# Patient Record
Sex: Female | Born: 1965 | Race: White | Hispanic: No | Marital: Single | Smoking: Current every day smoker
Health system: Southern US, Community
[De-identification: ages and names within clinical notes are randomized; demographics above are authoritative.]

## PROBLEM LIST (undated history)

## (undated) DIAGNOSIS — E079 Disorder of thyroid, unspecified: Secondary | ICD-10-CM

## (undated) DIAGNOSIS — E669 Obesity, unspecified: Secondary | ICD-10-CM

## (undated) DIAGNOSIS — B1920 Unspecified viral hepatitis C without hepatic coma: Secondary | ICD-10-CM

## (undated) HISTORY — DX: Obesity, unspecified: E66.9

## (undated) HISTORY — PX: TONSILLECTOMY: SHX5217

---

## 1997-11-14 HISTORY — PX: FRACTURE SURGERY: SHX138

## 2010-07-10 ENCOUNTER — Emergency Department (HOSPITAL_BASED_OUTPATIENT_CLINIC_OR_DEPARTMENT_OTHER): Admission: EM | Admit: 2010-07-10 | Discharge: 2010-07-10 | Payer: Self-pay | Admitting: Emergency Medicine

## 2010-07-14 ENCOUNTER — Ambulatory Visit: Payer: Self-pay | Admitting: Family

## 2010-07-14 DIAGNOSIS — F17200 Nicotine dependence, unspecified, uncomplicated: Secondary | ICD-10-CM

## 2010-07-14 DIAGNOSIS — R609 Edema, unspecified: Secondary | ICD-10-CM

## 2010-07-14 DIAGNOSIS — E876 Hypokalemia: Secondary | ICD-10-CM

## 2010-07-14 DIAGNOSIS — R7309 Other abnormal glucose: Secondary | ICD-10-CM

## 2010-07-14 LAB — CONVERTED CEMR LAB
ALT: 96 units/L — ABNORMAL HIGH (ref 0–35)
AST: 198 units/L — ABNORMAL HIGH (ref 0–37)
BUN: 10 mg/dL (ref 6–23)
Bilirubin, Direct: 0.9 mg/dL — ABNORMAL HIGH (ref 0.0–0.3)
CO2: 32 meq/L (ref 19–32)
Chloride: 94 meq/L — ABNORMAL LOW (ref 96–112)
Creatinine, Ser: 0.79 mg/dL (ref 0.40–1.20)
Indirect Bilirubin: 0 mg/dL (ref 0.0–0.9)
Potassium: 3.5 meq/L (ref 3.5–5.3)
Total Protein: 9.5 g/dL — ABNORMAL HIGH (ref 6.0–8.3)

## 2010-07-16 ENCOUNTER — Telehealth: Payer: Self-pay | Admitting: Family

## 2010-07-16 DIAGNOSIS — R799 Abnormal finding of blood chemistry, unspecified: Secondary | ICD-10-CM

## 2010-07-16 DIAGNOSIS — R740 Nonspecific elevation of levels of transaminase and lactic acid dehydrogenase [LDH]: Secondary | ICD-10-CM

## 2010-07-23 ENCOUNTER — Ambulatory Visit (HOSPITAL_BASED_OUTPATIENT_CLINIC_OR_DEPARTMENT_OTHER): Admission: RE | Admit: 2010-07-23 | Discharge: 2010-07-23 | Payer: Self-pay | Admitting: Internal Medicine

## 2010-07-23 ENCOUNTER — Ambulatory Visit: Payer: Self-pay | Admitting: Diagnostic Radiology

## 2010-07-25 ENCOUNTER — Telehealth: Payer: Self-pay | Admitting: Family

## 2010-07-29 ENCOUNTER — Encounter: Payer: Self-pay | Admitting: Family

## 2010-08-13 ENCOUNTER — Ambulatory Visit: Payer: Self-pay | Admitting: Family

## 2010-08-13 DIAGNOSIS — N6009 Solitary cyst of unspecified breast: Secondary | ICD-10-CM

## 2010-08-16 ENCOUNTER — Telehealth: Payer: Self-pay | Admitting: Family

## 2010-08-16 ENCOUNTER — Encounter: Payer: Self-pay | Admitting: Family

## 2010-08-16 ENCOUNTER — Encounter (INDEPENDENT_AMBULATORY_CARE_PROVIDER_SITE_OTHER): Payer: Self-pay | Admitting: *Deleted

## 2010-08-16 DIAGNOSIS — E8809 Other disorders of plasma-protein metabolism, not elsewhere classified: Secondary | ICD-10-CM

## 2010-08-30 ENCOUNTER — Ambulatory Visit: Payer: Self-pay | Admitting: Diagnostic Radiology

## 2010-08-30 ENCOUNTER — Ambulatory Visit (HOSPITAL_BASED_OUTPATIENT_CLINIC_OR_DEPARTMENT_OTHER): Admission: RE | Admit: 2010-08-30 | Discharge: 2010-08-30 | Payer: Self-pay | Admitting: Internal Medicine

## 2010-09-21 ENCOUNTER — Telehealth: Payer: Self-pay | Admitting: Family

## 2010-09-21 DIAGNOSIS — E039 Hypothyroidism, unspecified: Secondary | ICD-10-CM | POA: Insufficient documentation

## 2010-10-05 ENCOUNTER — Telehealth: Payer: Self-pay | Admitting: Family

## 2010-10-11 ENCOUNTER — Encounter: Payer: Self-pay | Admitting: Family

## 2010-11-04 ENCOUNTER — Telehealth: Payer: Self-pay | Admitting: Family

## 2010-11-05 ENCOUNTER — Encounter: Payer: Self-pay | Admitting: Family

## 2010-11-05 LAB — CONVERTED CEMR LAB
ALT: 53 units/L — ABNORMAL HIGH (ref 0–35)
ANA Titer 1: NEGATIVE
AST: 108 units/L — ABNORMAL HIGH (ref 0–37)
Albumin ELP: 32.9 % — ABNORMAL LOW (ref 55.8–66.1)
Albumin: 2.9 g/dL — ABNORMAL LOW (ref 3.5–5.2)
Alpha-1-Globulin: 3.8 % (ref 2.9–4.9)
Alpha-2-Globulin: 7.4 % (ref 7.1–11.8)
Beta Globulin: 6.1 % (ref 4.7–7.2)
Bilirubin, Direct: 0.1 mg/dL (ref 0.0–0.3)
Chloride: 97 meq/L (ref 96–112)
Creatinine, Ser: 0.83 mg/dL (ref 0.40–1.20)
Eosinophils Absolute: 0.1 10*3/uL (ref 0.0–0.7)
Ferritin: 12 ng/mL (ref 10–291)
Gamma Globulin: 45.2 % — ABNORMAL HIGH (ref 11.1–18.8)
HCT: 27.9 % — ABNORMAL LOW (ref 36.0–46.0)
Hemoglobin: 8.1 g/dL — ABNORMAL LOW (ref 12.0–15.0)
Hep A IgM: NEGATIVE
Hep B C IgM: NEGATIVE
Hepatitis B Surface Ag: NEGATIVE
Lymphs Abs: 2.3 10*3/uL (ref 0.7–4.0)
MCV: 78.8 fL (ref 78.0–100.0)
Monocytes Absolute: 0.6 10*3/uL (ref 0.1–1.0)
Monocytes Relative: 7 % (ref 3–12)
Neutrophils Relative %: 62 % (ref 43–77)
Potassium: 3.1 meq/L — ABNORMAL LOW (ref 3.5–5.3)
RBC: 3.54 M/uL — ABNORMAL LOW (ref 3.87–5.11)
TSH: 79.845 microintl units/mL — ABNORMAL HIGH (ref 0.350–4.500)
WBC: 8 10*3/uL (ref 4.0–10.5)

## 2010-11-08 ENCOUNTER — Telehealth: Payer: Self-pay | Admitting: Family

## 2010-11-09 ENCOUNTER — Ambulatory Visit: Payer: Self-pay | Admitting: Family

## 2010-11-11 ENCOUNTER — Telehealth: Payer: Self-pay | Admitting: Family

## 2010-11-12 DIAGNOSIS — D649 Anemia, unspecified: Secondary | ICD-10-CM

## 2010-11-12 LAB — CONVERTED CEMR LAB
Ferritin: 14 ng/mL (ref 10–291)
Folate: 6.3 ng/mL
HIV: NONREACTIVE
TIBC: 439 ug/dL (ref 250–470)

## 2010-11-17 ENCOUNTER — Encounter: Payer: Self-pay | Admitting: Family

## 2010-11-17 ENCOUNTER — Telehealth: Payer: Self-pay | Admitting: Family

## 2010-11-17 LAB — CONVERTED CEMR LAB: HCV Quantitative: 210000 intl units/mL — ABNORMAL HIGH (ref <43)

## 2010-11-18 ENCOUNTER — Encounter: Payer: Self-pay | Admitting: Family

## 2010-11-18 ENCOUNTER — Telehealth: Payer: Self-pay | Admitting: Family

## 2010-11-22 ENCOUNTER — Telehealth: Payer: Self-pay | Admitting: Internal Medicine

## 2010-11-26 ENCOUNTER — Encounter: Payer: Self-pay | Admitting: Family

## 2010-11-26 LAB — HM COLONOSCOPY: HM Colonoscopy: NORMAL

## 2010-11-29 ENCOUNTER — Telehealth: Payer: Self-pay | Admitting: Family

## 2010-12-03 ENCOUNTER — Ambulatory Visit
Admission: RE | Admit: 2010-12-03 | Discharge: 2010-12-03 | Payer: Self-pay | Source: Home / Self Care | Attending: Family | Admitting: Family

## 2010-12-03 ENCOUNTER — Ambulatory Visit: Admit: 2010-12-03 | Payer: Self-pay | Admitting: Family

## 2010-12-03 ENCOUNTER — Telehealth: Payer: Self-pay | Admitting: Family

## 2010-12-03 LAB — CONVERTED CEMR LAB
AFP-Tumor Marker: 5.2 ng/mL (ref 0.0–8.0)
BUN: 9 mg/dL (ref 6–23)
Chloride: 97 meq/L (ref 96–112)
Eosinophils Absolute: 0.1 10*3/uL (ref 0.0–0.7)
Glucose, Bld: 80 mg/dL (ref 70–99)
Lymphocytes Relative: 30 % (ref 12–46)
Lymphs Abs: 2.5 10*3/uL (ref 0.7–4.0)
MCV: 81 fL (ref 78.0–100.0)
Monocytes Relative: 8 % (ref 3–12)
Neutro Abs: 5 10*3/uL (ref 1.7–7.7)
Neutrophils Relative %: 60 % (ref 43–77)
Platelets: 190 10*3/uL (ref 150–400)
Potassium: 3.8 meq/L (ref 3.5–5.3)
RBC: 3.95 M/uL (ref 3.87–5.11)
Sodium: 134 meq/L — ABNORMAL LOW (ref 135–145)
WBC: 8.3 10*3/uL (ref 4.0–10.5)

## 2010-12-09 ENCOUNTER — Telehealth: Payer: Self-pay | Admitting: Family

## 2010-12-12 LAB — CONVERTED CEMR LAB
ALT: 56 units/L — ABNORMAL HIGH (ref 0–35)
AST: 117 units/L — ABNORMAL HIGH (ref 0–37)
Alkaline Phosphatase: 100 units/L (ref 39–117)
Bilirubin, Direct: 0.3 mg/dL (ref 0.0–0.3)
CO2: 33 meq/L — ABNORMAL HIGH (ref 19–32)
Calcium: 8.3 mg/dL — ABNORMAL LOW (ref 8.4–10.5)
Cholesterol: 118 mg/dL (ref 0–200)
Creatinine, Ser: 0.77 mg/dL (ref 0.40–1.20)
Glucose, Bld: 105 mg/dL — ABNORMAL HIGH (ref 70–99)
Indirect Bilirubin: 0.6 mg/dL (ref 0.0–0.9)
TSH: 24.439 microintl units/mL — ABNORMAL HIGH (ref 0.350–4.500)
Total Bilirubin: 0.9 mg/dL (ref 0.3–1.2)

## 2010-12-13 ENCOUNTER — Telehealth: Payer: Self-pay | Admitting: Family

## 2010-12-15 ENCOUNTER — Telehealth: Payer: Self-pay | Admitting: Family
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
## 2010-12-16 NOTE — Progress Notes (Signed)
  Phone Note Outgoing Call   Summary of Call: Left message on pt's cell requesting that she call me back to discuss lab work and follow up.  Initial call taken by: Lemont Fillers FNP,  October 05, 2010 8:22 AM  Follow-up for Phone Call        Requested that recall letter be mailed certified. Follow-up by: Lemont Fillers FNP,  October 11, 2010 8:50 AM

## 2010-12-16 NOTE — Miscellaneous (Signed)
Summary: Appointment Canceled  Appointment status changed to canceled by [employer] on 07/29/2010 1:48 PM.  Cancellation Comments --------------------- echo/edema  Appointment Information ----------------------- Appt Type:  CARDIOLOGY ANCILLARY VISIT      Date:  Friday, July 30, 2010      Time:  2:00 PM for 60 min   Urgency:  Routine   Made By:  Pearson Grippe  To Visit:  [id number]    Reason:  echo/edema  Appt Comments ------------- -- 07/29/10 13:48: ([facility]) CANCELED -- echo/edema -- 07/15/10 8:51: ([facility]) BOOKED -- Routine CARDIOLOGY ANCILLARY VISIT at 07/30/2010 2:00 PM for 60 min echo/edema

## 2010-12-16 NOTE — Progress Notes (Signed)
  Phone Note Outgoing Call   Call placed by: Lemont Fillers FNP,  July 16, 2010 9:18 AM Call placed to: Patient Summary of Call: Called patient and reviewed lab work.  Will send for abdominal US to evaluate liver.  Will also order acute hepatitis panel, Serum Iron, TIBC, TSH, and serum protein electrophoresis. Pt instructed to complete labs at time of ultraound. Initial call taken by: Lemont Fillers FNP,  July 16, 2010 9:24 AM  New Problems: PREVENTIVE HEALTH CARE (ICD-V70.0) OTHER NONSPECIFIC FINDINGS EXAMINATION OF BLOOD (ICD-790.99) TRANSAMINASES, SERUM, ELEVATED (ICD-790.4)   New Problems: PREVENTIVE HEALTH CARE (ICD-V70.0) OTHER NONSPECIFIC FINDINGS EXAMINATION OF BLOOD (ICD-790.99) TRANSAMINASES, SERUM, ELEVATED (ICD-790.4)

## 2010-12-16 NOTE — Assessment & Plan Note (Signed)
Summary: nurse visit for twinrix / tf,cma  Nurse Visit  CC: Pt here for first Twinrix inj. per order. Pt will need 2nd inj. in 1 month. Pt states she will call to schedule appt.   Allergies: No Known Drug Allergies  Immunizations Administered:  TwinRix # 1:    Vaccine Type: TwinRix    Site: left deltoid    Mfr: GlaxoSmithKline    Dose: 0.5 ml    Route: IM    Given by: Mervin Kung CMA (AAMA)    Exp. Date: 08/20/2012    Lot #: [id number]    VIS given: 08/02/07 version given December 03, 2010.  Orders Added: 1)  TwinRix 1ml ( Hep A&B Adult dose) [90636] 2)  Admin 1st Vaccine [90471]

## 2010-12-16 NOTE — Progress Notes (Signed)
Summary: nurse visit, iron supplement  Phone Note Outgoing Call   Summary of Call: Please call patient and arrange a nurse visit for Twinrix vaccine.  This will vaccinate her against Hep A and B which is important.  Should be given at 0, 1 and 6 months.  Also, I would like for her to do some additional lab work at that visit.  Lab work that has been completed thus far confirms that she does have Hep C.  HIV testing is negative. I have referred her to the Hepatitis C clinic,  and [facility].  She will be contacted by Myriam Jacobson about these appointments.  Also- her iron is low.  I would like for her to start an iron supplement as listed below.  Initial call taken by: Lemont Fillers FNP,  November 17, 2010 2:14 PM  Follow-up for Phone Call        Pt has been notified. Pt scheduled nurse visit on 11/26/10 for first Twinrix injection and to pick up an IFOB kit.  Will send order for additional labs.  Nicki Guadalajara Fergerson CMA (AAMA)  November 17, 2010 4:45 PM     New/Updated Medications: FERROUS SULFATE 325 (65 FE) MG TBEC (FERROUS SULFATE) one tablet by mouth three times a day

## 2010-12-16 NOTE — Progress Notes (Signed)
Summary: needs labs   Phone Note Call from Patient   Caller: Patient Call For: osullivan  Summary of Call: she states you need her to do labs for her thyroid meds.  please call about when you want her to do this.  She is off 12-23 12-26 and 12-27  call [phone] Initial call taken by: Roselle Locus,  November 04, 2010 3:01 PM  Follow-up for Phone Call        See 11/8 phone note for lab work to be drawn.  I would like her to complete the lab work today if possible and follow up in the office on Tuesday.  Also, please verify phone numbers and address.  Follow-up by: Lemont Fillers FNP,  November 05, 2010 7:59 AM  Additional Follow-up for Phone Call Additional follow up Details #1::        Pt notified and verified address and phone number on file are correct.  Pt will have labs drawn today and scheduled f/u with Melissa on 11/09/10 @ 2:30pm.  Lab order reprinted and faxed to the lab. Nicki Guadalajara Fergerson CMA Duncan Dull)  November 05, 2010 8:45 AM

## 2010-12-16 NOTE — Letter (Signed)
Summary: Generic Letter  [facility]  [address]   [place], [place] [address]   Phone: [phone]  Fax: [phone]    10/11/2010  Ajah Gavidia [address] [place], [place]  [address]  Dear Ms. Fortenberry,  We have tried unsuccessfully to contact you by phone.  I have reviewed your abnormal lab work and feel that we need to do some additional testing to determine the cause of the abnormalities.  It is important to your health that we look into this.  Please call our office at your earliest convenience to schedule a follow up appointment.       Sincerely,   Sandford Craze FNP  Appended Document: Generic Letter Certified letter was returned to [place] as unclaimed. Pt has since contacted our office and has f/u 11/12/10.

## 2010-12-16 NOTE — Progress Notes (Signed)
Summary: Wants anemia checked today  Phone Note Call from Patient   Caller: Patient Call For: Lemont Fillers FNP Summary of Call: Pt is coming today for nurse visit twinrix inj. and to have additional labs. Pt states GI did not f/u on her iron and wants to know if we can check her anemia today.  She has been taking Slow Fe once a day and is concerned the dose may not be high enough. Has done better on Slow Fe. Please advise.  Initial call taken by: Mervin Kung CMA Duncan Dull),  December 03, 2010 10:19 AM  Follow-up for Phone Call        Please have her complete CBC in lab today.  OK to add on to blood draw if already done. Follow-up by: Lemont Fillers FNP,  December 03, 2010 10:55 AM  Additional Follow-up for Phone Call Additional follow up Details #1::        Order added and faxed to the lab. Nicki Guadalajara Fergerson CMA Duncan Dull)  December 03, 2010 11:20 AM

## 2010-12-16 NOTE — Progress Notes (Signed)
Summary: Ferrous Sulfate side effects?  Phone Note Call from Patient Call back at [phone] wk until 5pm   Caller: Patient Call For: Lemont Fillers FNP Summary of Call: Pt called stating she started her Ferrous Sulfate this weekend. Took first pill with her evening meal and had N/V that night. Has not tried to take it again. Wants to know if there is something else she can try?  Please advise.   Follow-up for Phone Call        she can try Slow Fe Controlled-Release Capsules over the counter once daily Follow-up by: D. Thomos Lemons DO,  November 23, 2010 5:36 PM  Additional Follow-up for Phone Call Additional follow up Details #1::        call returned to patient at  [phone], she has been informed per Dr Artist Pais instructions. Patient has verbalized understanding. Additional Follow-up by: Glendell Docker CMA,  November 23, 2010 5:40 PM

## 2010-12-16 NOTE — Progress Notes (Signed)
  Phone Note Outgoing Call   Call placed by: Lemont Fillers FNP,  August 16, 2010 1:11 PM Call placed to: Patient Summary of Call: Left message on cell for patient to return my call. Initial call taken by: Lemont Fillers FNP,  August 16, 2010 1:16 PM  Follow-up for Phone Call        Reviewed labs and medication changes with patient.  Recommended that she return this week to lab for additional studies.   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) New/Updated Medications: KLOR-CON M20 20 MEQ CR-TABS (POTASSIUM CHLORIDE CRYS CR) one tablet by mouth once daily LEVOTHROID 50 MCG TABS (LEVOTHYROXINE SODIUM) one tablet by mouth once daily Prescriptions: LEVOTHROID 50 MCG TABS (LEVOTHYROXINE SODIUM) one tablet by mouth once daily  #30 x 1   Entered and Authorized by:   Lemont Fillers FNP   Signed by:   Lemont Fillers FNP on 08/16/2010   Method used:   Electronically to        [facility]* (retail)       [address]       [place], [place]  [address]       Ph: [phone]       Fax: [phone]   RxID:   [id number] KLOR-CON M20 20 MEQ CR-TABS (POTASSIUM CHLORIDE CRYS CR) one tablet by mouth once daily  #30 x 1   Entered and Authorized by:   Lemont Fillers FNP   Signed by:   Lemont Fillers FNP on 08/16/2010   Method used:   Electronically to        [facility]* (retail)       [address]       [place], [place]  [address]       Ph: [phone]       Fax: [phone]   RxID:   [id number]

## 2010-12-16 NOTE — Progress Notes (Signed)
  Phone Note Outgoing Call   Call placed by: Lemont Fillers FNP,  November 08, 2010 11:40 AM Call placed to: Patient Summary of Call: Left message on Pt's cell phone reminding her to keep her appointment tomorrow as we needs to review some important lab findings. Initial call taken by: Lemont Fillers FNP,  November 08, 2010 11:40 AM

## 2010-12-16 NOTE — Progress Notes (Signed)
Summary: additional labs needed  Phone Note Call from Patient   Summary of Call: Please call patient and let her know that I would like her to complete some lab work this week and schedule an appointment to be seen next week. Lab work should include ferritin , ceruloplam, ANA, ESR, BMET, LFTs, CBC, acute hepatitis panel  (all under 790.4),serum protein electrophoresis (273.8)and TSH (244.9).  It is very important that she complete these tests.   Initial call taken by: Lemont Fillers FNP,  September 21, 2010 11:07 AM  Follow-up for Phone Call        Left message on machine to return my call. Nicki Guadalajara Fergerson CMA Duncan Dull)  September 21, 2010 11:33 AM   Left message on machine to return my call. Nicki Guadalajara Fergerson CMA Duncan Dull)  September 22, 2010 8:46 AM   Left message on machine to return my call. Nicki Guadalajara Fergerson CMA Duncan Dull)  September 23, 2010 9:45 AM   Additional Follow-up for Phone Call Additional follow up Details #1::        call placed to patient at [phone], no answer a detailed voice message was left informing patient per [facility] instructions. Lab order has been entered for the week of 09/27/2010 Additional Follow-up by: Glendell Docker CMA,  September 24, 2010 11:42 AM  New Problems: HYPOTHYROIDISM (ICD-244.9)   New Problems: HYPOTHYROIDISM (ICD-244.9)
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
## 2010-12-16 NOTE — Letter (Signed)
Summary: Primary Care Consult Scheduled Letter  [place] [facility]  [address]   [place], [place] [address]   Phone: [phone]  Fax: [phone]      08/16/2010 MRN: [id number]  Luciel Gange [address] [place], [place]  [address]    Dear Ms. Bango,      We have scheduled an appointment for you.  At the recommendation of Melissa O'Sullivan,FNP , we have scheduled you a consult with [facility], DR Dion Body  on OCTOBER 20,2011 at 1PM.  Their address is[facility] [place], [address]  [address],[place]. The office phone number is [phone].  If this appointment day and time is not convenient for you, please feel free to call the office of the doctor you are being referred to at the number listed above and reschedule the appointment.     It is important for you to keep your scheduled appointments. We are here to make sure you are given good patient care.    Thank you,  Darral Dash Patient Care Coordinator [place] [facility]

## 2010-12-16 NOTE — Progress Notes (Signed)
  Phone Note Outgoing Call   Summary of Call: Called patient, reviewed ultrasound results.  Requested that she come fasting to her upcoming apt 9/30.   Initial call taken by: Lemont Fillers FNP,  July 26, 2010 10:35 AM

## 2010-12-16 NOTE — Progress Notes (Signed)
Summary: labs  Phone Note Outgoing Call   Summary of Call: Please call patient and ask her to complete lab work on the first floor if she has not already done so.  Initial call taken by: Lemont Fillers FNP,  November 29, 2010 9:44 AM  Follow-up for Phone Call        Pt has nurse visit scheduled for 12/06/10 for inj. and will have labs done at that time. Nicki Guadalajara Fergerson CMA (AAMA)  November 29, 2010 11:08 AM

## 2010-12-16 NOTE — Progress Notes (Signed)
Summary: lab result, iron dose  Phone Note Call from Patient Call back at Work Phone [phone]   Caller: Patient Call For: Lemont Fillers FNP Reason for Call: Lab or Test Results Summary of Call: Pt called wanting most recent test results. Also wants to know if it is ok to continue the iron once a day since she was having n/v with increased dose? Please advise. Nicki Guadalajara Fergerson CMA Duncan Dull)  December 09, 2010 9:45 AM   Follow-up for Phone Call        Hemoglobin was 8.1- now up to 9.8.  This is improving.  Her sodium looks better as well.  Ok to switch to slo-FE as below once daily. Follow-up by: Lemont Fillers FNP,  December 09, 2010 11:06 AM  Additional Follow-up for Phone Call Additional follow up Details #1::        Left message on machine to return my call.  Nicki Guadalajara Fergerson CMA Duncan Dull)  December 09, 2010 12:08 PM   Lleft message on machine to return my call. Nicki Guadalajara Fergerson CMA Duncan Dull)  December 10, 2010 8:41 AM      Additional Follow-up for Phone Call Additional follow up Details #2::    Pt advised.  Follow-up by: Mervin Kung CMA Duncan Dull),  December 10, 2010 9:35 AM  New/Updated Medications: SLOW RELEASE IRON 160 (50 FE) MG CR-TABS (FERROUS SULFATE DRIED) one tablet by mouth daily   Preventive Care Screening  Colonoscopy:    Date:  11/26/2010    Results:  normal. Lipoma ascending colon.

## 2010-12-16 NOTE — Progress Notes (Signed)
Summary: Order clarification  Phone Note Call from Patient   Caller: Patient Call For: Lemont Fillers FNP Summary of Call: Pt called to let us know that Dr Vernell Barrier is going to do an EGD / colonoscopy on pt on 11/26/10. Pt requested that we fax labs from 11/12/10 to Dr Vernell Barrier. Advised pt I will fax labs today. Pt was scheduled for nurse visit on 1/13 for inj., IFOB and additional labs. Pt has r/s nurse visit for 12/06/10 @ 3:30pm.  Do we still need to do an IFOB since pt will have already had the endoscopies?  Initial call taken by: Mervin Kung CMA Duncan Dull),  November 18, 2010 3:49 PM  Follow-up for Phone Call        OK to cancel IFOB. thanks Follow-up by: Lemont Fillers FNP,  November 18, 2010 8:32 PM
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
## 2010-12-16 NOTE — Progress Notes (Signed)
Summary: appt reminder  Phone Note Outgoing Call   Summary of Call: Please call patient and let her know that it is very important that she keep her appointment tomorrow.  I need to review some important lab results with her.  Initial call taken by: Lemont Fillers FNP,  November 11, 2010 10:06 AM  Follow-up for Phone Call        Left detailed message on pt's cell re: Melissa's instruction and reminder of appt. time. Nicki Guadalajara Fergerson CMA Duncan Dull)  November 11, 2010 10:51 AM

## 2010-12-22 NOTE — Progress Notes (Signed)
  Phone Note Outgoing Call   Call placed by: Lemont Fillers FNP,  December 13, 2010 11:00 AM Summary of Call: Left message for Dr. Myriam Forehand nurse requesting call back and clarification if they will officially be consulting/treating her hepatitis C. Initial call taken by: Lemont Fillers FNP,  December 13, 2010 11:01 AM  Follow-up for Phone Call        Call from [facility]   Jamesetta Orleans  [phone]  to confirm  Dr Tomasita Morrow will follow pt for Hep C Please call Ericka   Follow-up by: Darral Dash,  December 15, 2010 8:38 AM

## 2010-12-22 NOTE — Progress Notes (Signed)
----   Converted from flag ---- ---- 12/15/2010 10:36 AM, Mervin Kung CMA (AAMA) wrote: All labs have been faxed.  ---- 12/13/2010 12:41 PM, Lemont Fillers FNP wrote: could you please fax copy off all of her labs to Dr. Vernell Barrier at [facility]? ------------------------------

## 2010-12-27 ENCOUNTER — Encounter: Payer: Self-pay | Admitting: Family

## 2010-12-30 NOTE — Letter (Signed)
Summary: [facility]  [facility]   Imported By: Lanelle Bal 12/24/2010 11:39:57  _____________________________________________________________________  External Attachment:    Type:   Image     Comment:   External Document

## 2010-12-30 NOTE — Letter (Signed)
Summary: [facility]  [facility]   Imported By: Lanelle Bal 12/24/2010 11:36:17  _____________________________________________________________________  External Attachment:    Type:   Image     Comment:   External Document

## 2010-12-30 NOTE — Procedures (Signed)
Summary: EGD & Colonoscopy/[facility]  EGD & Colonoscopy/[facility]   Imported By: Lanelle Bal 12/24/2010 11:37:35  _____________________________________________________________________  External Attachment:    Type:   Image     Comment:   External Document

## 2011-01-06 ENCOUNTER — Telehealth: Payer: Self-pay | Admitting: Family

## 2011-01-07 ENCOUNTER — Ambulatory Visit (INDEPENDENT_AMBULATORY_CARE_PROVIDER_SITE_OTHER): Payer: PRIVATE HEALTH INSURANCE

## 2011-01-07 ENCOUNTER — Encounter: Payer: Self-pay | Admitting: Family

## 2011-01-07 DIAGNOSIS — Z23 Encounter for immunization: Secondary | ICD-10-CM

## 2011-01-11 NOTE — Letter (Signed)
Summary: [facility]  [facility]   Imported By: Maryln Gottron 01/06/2011 11:31:14  _____________________________________________________________________  External Attachment:    Type:   Image     Comment:   External Document

## 2011-01-11 NOTE — Progress Notes (Signed)
Summary: CAN YOU GIVE HER A HEP VACCINE ON FRIDAY AFTERNOON   Phone Note Call from Patient Call back at [facility] Phone [phone]   Caller: Patient Call For: Chanoch Mccleery  Summary of Call: SHE NEEDS A HEPATITIS VACINNATION.  CAN SHE COME FRIDAY AFTERNOON  Initial call taken by: Roselle Locus,  January 06, 2011 11:34 AM  Follow-up for Phone Call        Spoke with pt, inj. appt has been scheduled for 01/07/11 @ 4:20. Nicki Guadalajara Fergerson CMA Duncan Dull)  January 06, 2011 11:46 AM

## 2011-01-11 NOTE — Assessment & Plan Note (Signed)
Summary: twinrix #2  Nurse Visit   Allergies: No Known Drug Allergies  Immunizations Administered:  TwinRix # 2:    Vaccine Type: TwinRix    Site: right deltoid    Mfr: GlaxoSmithKline    Dose: 1.0 ml    Route: IM    Given by: Mervin Kung CMA (AAMA)    Exp. Date: 08/20/2012    Lot #: [id number]    VIS given: 08/02/07 version given January 07, 2011.  Orders Added: 1)  TwinRix 1ml ( Hep A&B Adult dose) [90636] 2)  Admin 1st Vaccine [90471]  Pt will return in July for 3rd injection. Nicki Guadalajara Fergerson CMA Duncan Dull)  January 07, 2011 4:27 PM

## 2011-01-14 ENCOUNTER — Telehealth: Payer: Self-pay | Admitting: Family

## 2011-01-20 NOTE — Progress Notes (Signed)
Summary: refill--lasix  Phone Note Refill Request Message from:  Patient on January 14, 2011 5:03 PM  Refills Requested: Medication #1:  LASIX 40 MG TABS Take 1 tablet by mouth once a day.   Dosage confirmed as above?Dosage Confirmed   Supply Requested: 1 month   Last Refilled: 01/06/2011 Next Appointment Scheduled: 02-09-11 Peggyann Juba, NP Initial call taken by: Mervin Kung CMA (AAMA),  January 14, 2011 5:03 PM    Prescriptions: LASIX 40 MG TABS (FUROSEMIDE) Take 1 tablet by mouth once a day.  #30 Tablet x 0   Entered by:   Mervin Kung CMA (AAMA)   Authorized by:   Lemont Fillers FNP   Signed by:   Mervin Kung CMA (AAMA) on 01/14/2011   Method used:   Electronically to        [facility]* (retail)       [address]       [place], [place]  [address]       Ph: [phone]       Fax: [phone]   RxID:   [id number]

## 2011-01-25 ENCOUNTER — Telehealth: Payer: Self-pay | Admitting: Family

## 2011-01-28 LAB — BASIC METABOLIC PANEL
Calcium: 7.9 mg/dL — ABNORMAL LOW (ref 8.4–10.5)
GFR calc Af Amer: >60 mL/min (ref >60)
GFR calc non Af Amer: >60 mL/min (ref >60)
Potassium: 3.2 mEq/L — ABNORMAL LOW (ref 3.5–5.1)
Sodium: 140 mEq/L (ref 135–145)

## 2011-01-28 LAB — URINALYSIS, ROUTINE W REFLEX MICROSCOPIC
Hgb urine dipstick: NEGATIVE
Nitrite: NEGATIVE
Specific Gravity, Urine: 1.018 (ref 1.005–1.030)
Urobilinogen, UA: 1 mg/dL (ref 0.0–1.0)
pH: 6.5 (ref 5.0–8.0)

## 2011-02-01 NOTE — Progress Notes (Signed)
  Phone Note Outgoing Call   Summary of Call: Reviewed lab results from Dr. Myriam Forehand office- TSH 30.25 on 01/21/11.  Spoke with patient.  She reports + compliance with her medication.  Will increase levothyroxine to .  Pt was instructed to keep her upcoming appointment. Initial call taken by: Lemont Fillers FNP,  January 25, 2011 9:47 AM    New/Updated Medications: LEVOTHROID 100 MCG TABS (LEVOTHYROXINE SODIUM) one tab by mouth daily Prescriptions: LEVOTHROID 100 MCG TABS (LEVOTHYROXINE SODIUM) one tab by mouth daily  #30 x 1   Entered and Authorized by:   Lemont Fillers FNP   Signed by:   Lemont Fillers FNP on 01/25/2011   Method used:   Electronically to        [facility]* (retail)       [address]       [place], [place]  [address]       Ph: [phone]       Fax: [phone]   RxID:   [id number]

## 2011-02-08 ENCOUNTER — Encounter: Payer: Self-pay | Admitting: Family

## 2011-02-09 ENCOUNTER — Encounter: Payer: Self-pay | Admitting: Family

## 2011-02-09 ENCOUNTER — Ambulatory Visit (INDEPENDENT_AMBULATORY_CARE_PROVIDER_SITE_OTHER): Payer: PRIVATE HEALTH INSURANCE | Admitting: Family

## 2011-02-09 DIAGNOSIS — D649 Anemia, unspecified: Secondary | ICD-10-CM

## 2011-02-09 DIAGNOSIS — B182 Chronic viral hepatitis C: Secondary | ICD-10-CM

## 2011-02-09 DIAGNOSIS — E876 Hypokalemia: Secondary | ICD-10-CM

## 2011-02-09 DIAGNOSIS — F17200 Nicotine dependence, unspecified, uncomplicated: Secondary | ICD-10-CM

## 2011-02-09 DIAGNOSIS — E039 Hypothyroidism, unspecified: Secondary | ICD-10-CM

## 2011-02-09 LAB — BASIC METABOLIC PANEL
BUN: 6 mg/dL (ref 6–23)
Potassium: 3.6 mEq/L (ref 3.5–5.3)
Sodium: 132 mEq/L — ABNORMAL LOW (ref 135–145)

## 2011-02-09 LAB — TSH: TSH: 25.781 u[IU]/mL — ABNORMAL HIGH (ref 0.350–4.500)
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
## 2011-02-09 NOTE — Patient Instructions (Signed)
Please complete your lab work on the first floor. Good luck with your upcoming treatments.

## 2011-02-10 ENCOUNTER — Telehealth: Payer: Self-pay | Admitting: Family

## 2011-02-10 DIAGNOSIS — E039 Hypothyroidism, unspecified: Secondary | ICD-10-CM

## 2011-02-10 LAB — CBC WITH DIFFERENTIAL/PLATELET
Basophils Absolute: 0.1 10*3/uL (ref 0.0–0.1)
Lymphocytes Relative: 26 % (ref 12–46)
Neutro Abs: 7.4 10*3/uL (ref 1.7–7.7)
Neutrophils Relative %: 64 % (ref 43–77)
Platelets: 164 10*3/uL (ref 150–400)
RBC: 4.4 MIL/uL (ref 3.87–5.11)
RDW: 19.8 % — ABNORMAL HIGH (ref 11.5–15.5)
WBC: 11.6 10*3/uL — ABNORMAL HIGH (ref 4.0–10.5)

## 2011-02-10 MED ORDER — LEVOTHYROXINE SODIUM 125 MCG PO TABS: 125.0000 ug | ORAL_TABLET | Freq: Every day | ORAL | Status: DC

## 2011-02-10 NOTE — Assessment & Plan Note (Signed)
TSH improved, but not yet at goal- increase TSH to 125- see phone note.

## 2011-02-10 NOTE — Telephone Encounter (Signed)
Call pt, let her know that her thyroid testing is improving, but we still need to increase her thyroid medication.  She should stop the Levothyroxine and start once daily.

## 2011-02-10 NOTE — Assessment & Plan Note (Signed)
She is still smoking.  She has not yet started the chantix.  I have advised her to hold off on  Starting the chantix at this time as her Hep C therapy could cause depression and it may not be a safe combination for her to start chantix at the same time as this may also cause depression.  Pt verbalizes understanding.

## 2011-02-10 NOTE — Telephone Encounter (Signed)
Pt also needs TSH follow up in 6 weeks per verbal order from [facility].

## 2011-02-10 NOTE — Telephone Encounter (Signed)
Pt was advised per Melissa's instructions. She will return to the lab in 6 weeks for repeat TSH. Order entered and faxed to the lab.

## 2011-02-10 NOTE — Assessment & Plan Note (Signed)
Patient was instructed to keep her scheduled follow up with Dr. Marcelene Butte.  She is due for her 3rd twinrix on 7/24. She has stopped ETOH and I have commended her for that.

## 2011-02-12 ENCOUNTER — Other Ambulatory Visit: Payer: Self-pay | Admitting: Family

## 2011-02-20 ENCOUNTER — Telehealth: Payer: Self-pay | Admitting: Family

## 2011-02-20 NOTE — Telephone Encounter (Signed)
Message copied by Sandford Craze on Sun Feb 20, 2011  9:18 PM ------      Message from: O'SULLIVAN, Arlo Buffone      Created: Thu Feb 10, 2011  9:44 AM       Patient needs 3rd twinrix

## 2011-03-08 ENCOUNTER — Telehealth: Payer: Self-pay | Admitting: *Deleted

## 2011-03-08 NOTE — Telephone Encounter (Signed)
Pt states she is having bloodwork taken at Dr Baltazar Najjar office tomorrow. She is going to have his office check her TSH so she doesn't have to be stuck twice. She will have them forward results to our office. Pt would like [place] to call the results to her once we receive them.

## 2011-03-11 ENCOUNTER — Other Ambulatory Visit: Payer: Self-pay | Admitting: Family

## 2011-03-15 MED ORDER — LEVOTHYROXINE SODIUM 150 MCG PO TABS: 150.0000 ug | ORAL_TABLET | Freq: Every day | ORAL | Status: DC

## 2011-03-15 NOTE — Telephone Encounter (Signed)
TSH result received and forwarded to Provider for review. Please advise.

## 2011-03-15 NOTE — Telephone Encounter (Signed)
Please call patient and let her know that her thyroid test is improving.  I would like to increase her thyroid med a touch more to .  This has been sent to her pharmacy- she will need follow up TSH in 1 month.

## 2011-03-15 NOTE — Telephone Encounter (Signed)
Pt notified and states that she is having labs drawn every 2 weeks with Dr Marcelene Butte and will have him to check her TSH level in 1 month and fax [place] the result.

## 2011-05-16 ENCOUNTER — Other Ambulatory Visit: Payer: Self-pay | Admitting: Family

## 2011-05-17 NOTE — Telephone Encounter (Signed)
Left detailed message on pt's cell phone reminding pt that she was due for a TSH at the end of May or early June and we have not received that result yet. Requested pt contact Dr Baltazar Najjar office and have them forward her most current result to [place] as last check we have was 03/08/11. If test hasn't been repeated she will need to have TSH checked now. 30 day supply given until we can get current TSH result. Advised pt to call if any questions.

## 2011-06-08 ENCOUNTER — Telehealth: Payer: Self-pay | Admitting: *Deleted

## 2011-06-08 NOTE — Telephone Encounter (Signed)
Pt was due for repeat TSH around the end of May. We have not received lab result from Dr Baltazar Najjar office. Spoke with pt, she does not remember if it was checked in May. She will talk with the nurse today and have them fax [place] the result if it was done. It not done, she will ask them to add it to this week's lab draw.

## 2011-06-14 NOTE — Telephone Encounter (Signed)
Still have not received recent TSH result from Dr Baltazar Najjar office and pt is due for 3rd twinrix inj. Left message for pt to return my call.

## 2011-06-15 NOTE — Telephone Encounter (Signed)
Received TSH from April and forwarded to Provider for review. Pt left message that they are checking her TSH again today and will forward result when available. Pt scheduled nurse visit for 06/17/11 at 4pm for 3rd twinrix injection. Please advise.

## 2011-06-15 NOTE — Telephone Encounter (Signed)
Pt will need refill of thyroid medication once we get result from today's check. She states she has some tablets at home and will take 1 1/2 of those until refill can be addressed.

## 2011-06-17 ENCOUNTER — Ambulatory Visit: Payer: PRIVATE HEALTH INSURANCE

## 2011-06-20 ENCOUNTER — Ambulatory Visit: Payer: PRIVATE HEALTH INSURANCE | Admitting: Family

## 2011-06-20 DIAGNOSIS — Z23 Encounter for immunization: Secondary | ICD-10-CM

## 2011-06-20 MED ORDER — LEVOTHYROXINE SODIUM 150 MCG PO TABS: 150.0000 ug | ORAL_TABLET | Freq: Every day | ORAL | Status: DC

## 2011-06-20 NOTE — Telephone Encounter (Signed)
Please call pt and let her know that her most recent TSH is normal.  She should continue Synthroid 150.  Follow up with [place] in 3 months.  Refill sent to her pharmacy.

## 2011-06-21 NOTE — Telephone Encounter (Signed)
Left detailed message on cell re: instructions below and to call and arrange appt.

## 2011-07-25 ENCOUNTER — Other Ambulatory Visit: Payer: Self-pay | Admitting: *Deleted

## 2011-07-25 NOTE — Telephone Encounter (Signed)
Received message from pt stating she needs refill on Potassium but has only been taking it once daily instead of twice a day. Pt states her last potassium level was normal and wants to know if we will send refill with once daily directions. Please advise.

## 2011-07-26 MED ORDER — POTASSIUM CHLORIDE CRYS ER 20 MEQ PO TBCR: 20.0000 meq | EXTENDED_RELEASE_TABLET | Freq: Every day | ORAL | Status: DC

## 2011-07-26 NOTE — Telephone Encounter (Signed)
Rx has been sent for 30 day supply once daily  She is due for follow up in our office, no further refills until seen.

## 2011-07-26 NOTE — Telephone Encounter (Signed)
Left detailed message on cell # that potassium rx has been completed for a 30 day supply with once daily dosing and she will need to call the office to arrange follow up before further refills can be provided.

## 2011-10-03 ENCOUNTER — Other Ambulatory Visit: Payer: Self-pay | Admitting: Family

## 2011-10-03 NOTE — Telephone Encounter (Signed)
Refills sent to pharmacy for # 14 Klor Con, Furosemide and Synthroid x no refills until pt can be seen. Pt was due for f/u in June. Please call pt to arrange appt.

## 2011-10-04 NOTE — Telephone Encounter (Signed)
Patient made appt for 10/12/11

## 2011-10-12 ENCOUNTER — Ambulatory Visit: Payer: PRIVATE HEALTH INSURANCE | Admitting: Family

## 2011-10-14 ENCOUNTER — Ambulatory Visit (INDEPENDENT_AMBULATORY_CARE_PROVIDER_SITE_OTHER): Payer: PRIVATE HEALTH INSURANCE | Admitting: Family

## 2011-10-14 ENCOUNTER — Encounter: Payer: Self-pay | Admitting: Family

## 2011-10-14 DIAGNOSIS — B182 Chronic viral hepatitis C: Secondary | ICD-10-CM

## 2011-10-14 DIAGNOSIS — R609 Edema, unspecified: Secondary | ICD-10-CM

## 2011-10-14 DIAGNOSIS — E039 Hypothyroidism, unspecified: Secondary | ICD-10-CM

## 2011-10-14 DIAGNOSIS — E876 Hypokalemia: Secondary | ICD-10-CM

## 2011-10-14 DIAGNOSIS — F17200 Nicotine dependence, unspecified, uncomplicated: Secondary | ICD-10-CM

## 2011-10-14 MED ORDER — POTASSIUM CHLORIDE CRYS ER 20 MEQ PO TBCR: EXTENDED_RELEASE_TABLET | ORAL | Status: DC

## 2011-10-14 MED ORDER — FUROSEMIDE 40 MG PO TABS: ORAL_TABLET | ORAL | Status: DC

## 2011-10-14 NOTE — Assessment & Plan Note (Signed)
Clinically stable on levothyroxine, will check TSH today.

## 2011-10-14 NOTE — Assessment & Plan Note (Signed)
Pt has completed her Hepatits C treatment through Dr. Marcelene Butte.  She has had undectable viral loads.  Defer management to hepatology.

## 2011-10-14 NOTE — Assessment & Plan Note (Signed)
I encourage her to set a quit date for early next year. She has chantix rx at home.

## 2011-10-14 NOTE — Assessment & Plan Note (Signed)
Stable, will try changing lasix to PRN and see how she does.

## 2011-10-14 NOTE — Assessment & Plan Note (Signed)
Will repeat BMET today.  And then again in 1 month to make sure that it is stable as we are changing her potassium supplement to be taken only with PRN lasix.

## 2011-10-14 NOTE — Patient Instructions (Signed)
Please complete your lab work prior to leaving today. Return to the lab in 1 month for blood work.  Follow up in 6 months.
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
## 2011-10-15 ENCOUNTER — Telehealth: Payer: Self-pay | Admitting: Family

## 2011-10-15 DIAGNOSIS — E039 Hypothyroidism, unspecified: Secondary | ICD-10-CM

## 2011-10-15 LAB — BASIC METABOLIC PANEL
BUN: 7 mg/dL (ref 6–23)
CO2: 26 mEq/L (ref 19–32)
Calcium: 8.3 mg/dL — ABNORMAL LOW (ref 8.4–10.5)
Creat: 0.82 mg/dL (ref 0.50–1.10)
Glucose, Bld: 87 mg/dL (ref 70–99)
Sodium: 134 mEq/L — ABNORMAL LOW (ref 135–145)

## 2011-10-15 LAB — TSH: TSH: 17.43 u[IU]/mL — ABNORMAL HIGH (ref 0.350–4.500)

## 2011-10-15 MED ORDER — LEVOTHYROXINE SODIUM 175 MCG PO TABS: 175.0000 ug | ORAL_TABLET | Freq: Every day | ORAL | Status: DC

## 2011-10-15 NOTE — Telephone Encounter (Signed)
Pls call pt and let her know that her lab work shows that her thyroid medication should be increased. I have sent rx for levothyroxine to her pharmacy.  She will need TSH in 6 weeks pls.

## 2011-10-17 NOTE — Telephone Encounter (Signed)
Left detailed message on pt's cell # and to call if any questions. Lab order has been entered for the week of 11/28/11 and reminder has been mailed to pt.

## 2011-10-19 ENCOUNTER — Telehealth: Payer: Self-pay | Admitting: *Deleted

## 2011-10-19 DIAGNOSIS — E876 Hypokalemia: Secondary | ICD-10-CM

## 2011-10-19 NOTE — Telephone Encounter (Signed)
Message copied by Kathi Simpers on Wed Oct 19, 2011  8:08 AM ------      Message from: O'SULLIVAN, MELISSA      Created: Fri Oct 14, 2011  1:41 PM       Pls send BMET order to lab for 1 month, diagnosis is hypokalemia

## 2011-10-19 NOTE — Telephone Encounter (Signed)
Future lab order has been placed for the week of 11/14/11 and copy forwarded to the lab.

## 2011-10-28 NOTE — Telephone Encounter (Signed)
Notified pt that per [facility], it is ok to have TSH checked at the December lab draw so she will not need to make two lab trips. Combined prelim orders have been faxed to the lab.

## 2012-01-26 ENCOUNTER — Other Ambulatory Visit: Payer: Self-pay | Admitting: *Deleted

## 2012-01-26 MED ORDER — LEVOTHYROXINE SODIUM 175 MCG PO TABS: 175.0000 ug | ORAL_TABLET | Freq: Every day | ORAL | Status: DC

## 2012-01-26 NOTE — Telephone Encounter (Signed)
Per verbal from [facility], [place], should take medication x 4 weeks before completing labs. Left detailed message on pt's cell# and to call if any questions.

## 2012-01-26 NOTE — Telephone Encounter (Signed)
Received call from pt stating she has been out of thyroid medication x 2 1/2 weeks due to job and transportation issues. 30 day supply sent to pharmacy.  Pt has not completed additional labs for January and wanted to know how long she should wait to have thyroid checked once she resumes medication?

## 2012-03-09 ENCOUNTER — Telehealth: Payer: Self-pay | Admitting: Family

## 2012-03-09 MED ORDER — LEVOTHYROXINE SODIUM 175 MCG PO TABS: 175.0000 ug | ORAL_TABLET | Freq: Every day | ORAL | Status: DC

## 2012-03-09 NOTE — Telephone Encounter (Signed)
Patient walked in to the office to have blood drawn for her thyroid. She states that she wanted to let us know that she is out of levothyroxine and would like a refill to be sent to [facility] on [address] in [place].

## 2012-03-09 NOTE — Telephone Encounter (Signed)
Spoke with pt and she states she has been out of medication x 4 days. Advised her per Sandford Craze, NP that we will send 1 week supply to pharmacy until results are back and adjustment is determined. Pt voices understanding. Refill sent to pharmacy.

## 2012-03-10 LAB — TSH: TSH: 40.587 u[IU]/mL — ABNORMAL HIGH (ref 0.350–4.500)

## 2012-03-12 ENCOUNTER — Telehealth: Payer: Self-pay | Admitting: Family

## 2012-03-12 NOTE — Telephone Encounter (Signed)
Left message on cell# to return my call. 

## 2012-03-12 NOTE — Telephone Encounter (Signed)
Please call pt and let her know that her thyroid testing is showing that she has not been taking her medication regularly.  She is due back in office for follow up.  OK to send additional 21 tabs of levothyroxine to her pharmacy.  Needs OV please.

## 2012-03-13 MED ORDER — LEVOTHYROXINE SODIUM 175 MCG PO TABS: 175.0000 ug | ORAL_TABLET | Freq: Every day | ORAL | Status: DC

## 2012-03-13 NOTE — Telephone Encounter (Signed)
Notified pt and sent additional 21 tabs to pharmacy.

## 2012-03-13 NOTE — Telephone Encounter (Signed)
Left message on cell# to return my call. 

## 2012-03-23 ENCOUNTER — Encounter: Payer: Self-pay | Admitting: Family

## 2012-03-23 ENCOUNTER — Telehealth: Payer: Self-pay | Admitting: *Deleted

## 2012-03-23 ENCOUNTER — Ambulatory Visit (INDEPENDENT_AMBULATORY_CARE_PROVIDER_SITE_OTHER): Payer: PRIVATE HEALTH INSURANCE | Admitting: Family

## 2012-03-23 ENCOUNTER — Other Ambulatory Visit: Payer: Self-pay | Admitting: Family

## 2012-03-23 VITALS — BP 128/90 | HR 84 | Temp 98.0°F | Resp 16 | Ht 67.0 in | Wt 231.0 lb

## 2012-03-23 DIAGNOSIS — R609 Edema, unspecified: Secondary | ICD-10-CM

## 2012-03-23 DIAGNOSIS — E039 Hypothyroidism, unspecified: Secondary | ICD-10-CM

## 2012-03-23 DIAGNOSIS — B182 Chronic viral hepatitis C: Secondary | ICD-10-CM

## 2012-03-23 DIAGNOSIS — E871 Hypo-osmolality and hyponatremia: Secondary | ICD-10-CM

## 2012-03-23 LAB — BASIC METABOLIC PANEL
Calcium: 8.2 mg/dL — ABNORMAL LOW (ref 8.4–10.5)
Creat: 0.98 mg/dL (ref 0.50–1.10)
Sodium: 133 mEq/L — ABNORMAL LOW (ref 135–145)

## 2012-03-23 MED ORDER — LEVOTHYROXINE SODIUM 175 MCG PO TABS: 175.0000 ug | ORAL_TABLET | Freq: Every day | ORAL | Status: DC

## 2012-03-23 NOTE — Telephone Encounter (Signed)
Future lab order placed and given to the lab. 

## 2012-03-23 NOTE — Assessment & Plan Note (Signed)
Stable with PRN lasix.  Check BMET today.

## 2012-03-23 NOTE — Patient Instructions (Signed)
Please come to the lab around 6/15 for repeat thyroid testing.  Please schedule a follow up appointment in 3 months.

## 2012-03-23 NOTE — Telephone Encounter (Signed)
Message copied by Kathi Simpers on Fri Mar 23, 2012  3:58 PM ------      Message from: O'SULLIVAN, MELISSA      Created: Fri Mar 23, 2012  3:57 PM       Pls send tsh for 6/15 to lab. thanks

## 2012-03-23 NOTE — Assessment & Plan Note (Signed)
Management per hepatology.  °
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
## 2012-03-23 NOTE — Assessment & Plan Note (Signed)
Deteriorated due to lack of compliance.  TSH was >40.  Reinforced importance of compliance.  Plan to recheck TSH in 6 weeks.

## 2012-03-27 ENCOUNTER — Telehealth: Payer: Self-pay | Admitting: Family

## 2012-03-27 DIAGNOSIS — E611 Iron deficiency: Secondary | ICD-10-CM

## 2012-03-27 DIAGNOSIS — B1920 Unspecified viral hepatitis C without hepatic coma: Secondary | ICD-10-CM

## 2012-03-27 LAB — HEPATIC FUNCTION PANEL
Alkaline Phosphatase: 127 U/L — ABNORMAL HIGH (ref 39–117)
Bilirubin, Direct: 1.1 mg/dL — ABNORMAL HIGH (ref 0.0–0.3)
Indirect Bilirubin: 1.1 mg/dL — ABNORMAL HIGH (ref 0.0–0.9)
Total Bilirubin: 2.2 mg/dL — ABNORMAL HIGH (ref 0.3–1.2)
Total Protein: 9 g/dL — ABNORMAL HIGH (ref 6.0–8.3)

## 2012-03-27 NOTE — Telephone Encounter (Signed)
Spoke to pt re: Elevated LFT's.  She denies epigastric pain.  Will check hep c viral load and abdominal ultrasound.  Recommended that she schedule a follow up apt with hepatology.  Pt verbalizes understanding.

## 2012-03-27 NOTE — Telephone Encounter (Signed)
Left message requesting call back from patient.

## 2012-03-30 ENCOUNTER — Ambulatory Visit (HOSPITAL_BASED_OUTPATIENT_CLINIC_OR_DEPARTMENT_OTHER)
Admission: RE | Admit: 2012-03-30 | Discharge: 2012-03-30 | Disposition: A | Discharge status: Home / Self Care | Payer: No Typology Code available for payment source | Source: Ambulatory Visit | Attending: Family | Admitting: Family

## 2012-03-30 DIAGNOSIS — K739 Chronic hepatitis, unspecified: Secondary | ICD-10-CM | POA: Insufficient documentation

## 2012-04-02 ENCOUNTER — Telehealth: Payer: Self-pay | Admitting: Family

## 2012-04-02 DIAGNOSIS — B1920 Unspecified viral hepatitis C without hepatic coma: Secondary | ICD-10-CM

## 2012-04-02 NOTE — Telephone Encounter (Signed)
Pls call Dr. Baltazar Najjar office and arrange a follow up appointment for patient.  I would like for her to be seen in the next week or so please.

## 2012-04-02 NOTE — Telephone Encounter (Signed)
Spoke with Coralee North, Dr Hurrelbrink's nurse re: patient's follow up. She states pt was last seen on 06/15/11. She stopped treatment for Hep C as pt did not think she could tolerate it. No future appts were scheduled. Coralee North confirmed receipt of labs/us that I faxed to them. She states that Dr Marcelene Butte doesn't have any available openings for at 3 weeks. She will forward our fax to him to determine need for pt's follow up and they will call us back.

## 2012-04-02 NOTE — Telephone Encounter (Signed)
Left message for pt to return my call.

## 2012-04-05 NOTE — Telephone Encounter (Signed)
Dr Marcelene Butte called to say that pt failed standard therapy treatment of hep C and he does not have any other treatment options for pt.  Advises that we refer pt to [facility] or [facility] for failure of standard treatment and trial of new protease inhibitor treatment if we are concerned that pt's cirrhosis may be worsening.  Please advise.

## 2012-04-06 NOTE — Telephone Encounter (Signed)
Addended by: Sandford Craze on: 04/06/2012 08:41 AM   Modules accepted: Orders

## 2012-04-06 NOTE — Telephone Encounter (Addendum)
Left message requesting that pt return our call.  When she calls back, please let her know that her liver ultrasound and liver function tests look worse than before.  We have reviewed her case with Dr. Marcelene Butte and he thinks that we need to refer her to a larger treatment center for hepatitis C treatment where she may be able to receive some newer treatment options.  I have placed referral and Myriam Jacobson should be contacting her with details when arranged.   Pls let patient know that it is very important that she follow through with this referral.

## 2012-04-10 NOTE — Telephone Encounter (Signed)
Addended by: Sandford Craze on: 04/10/2012 04:54 PM   Modules accepted: Orders

## 2012-04-17 ENCOUNTER — Telehealth: Payer: Self-pay | Admitting: Family

## 2012-04-17 NOTE — Telephone Encounter (Signed)
Pt returned my call and voices understanding. She states [facility] has left her a message and she will call them back to arrange appt. Pt requests that we call her wk# in the future as she does not have cell service at work.

## 2012-04-17 NOTE — Telephone Encounter (Signed)
Left message on cell# for pt to call re: referral.

## 2012-04-20 NOTE — Telephone Encounter (Signed)
Opened in error

## 2012-04-23 ENCOUNTER — Encounter: Payer: Self-pay | Admitting: Family

## 2012-05-20 IMAGING — US US ABDOMEN COMPLETE
1 series · 13 of 25 positions shown · non-contrast
Comparison: 07/23/2010.

CLINICAL DATA: 46-year-old female with abnormal liver function
test.  Hepatitis C.

COMPLETE ABDOMINAL ULTRASOUND

[Series 1: us abdomen complete · 0.41mm/px · 13 of 107 slices shown]
[im 1/107]
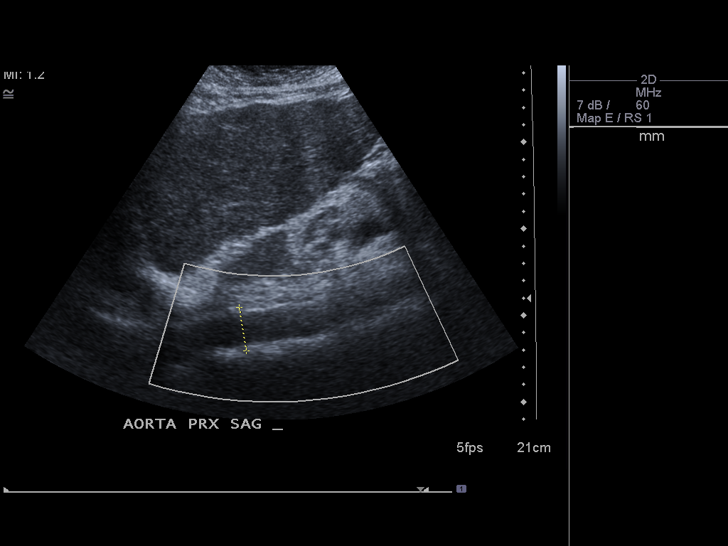
[im 9/107]
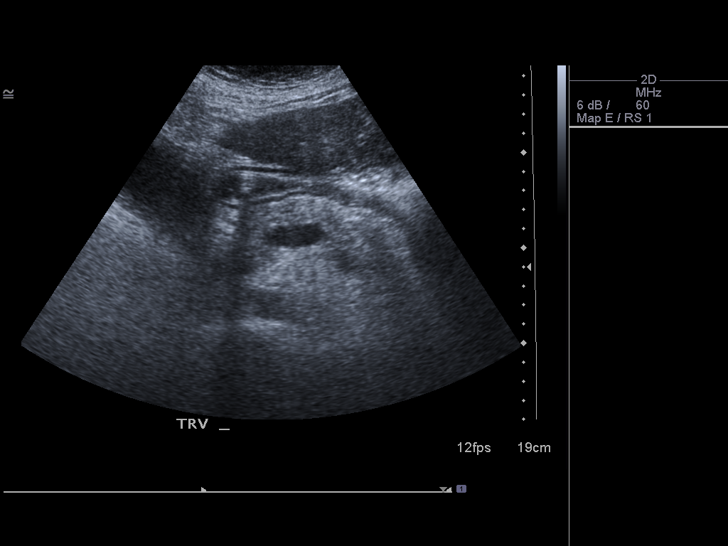
[im 18/107]
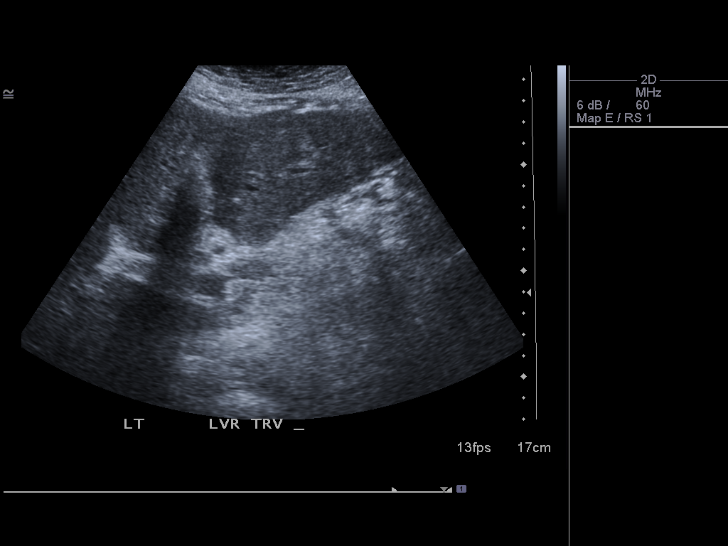
[im 27/107]
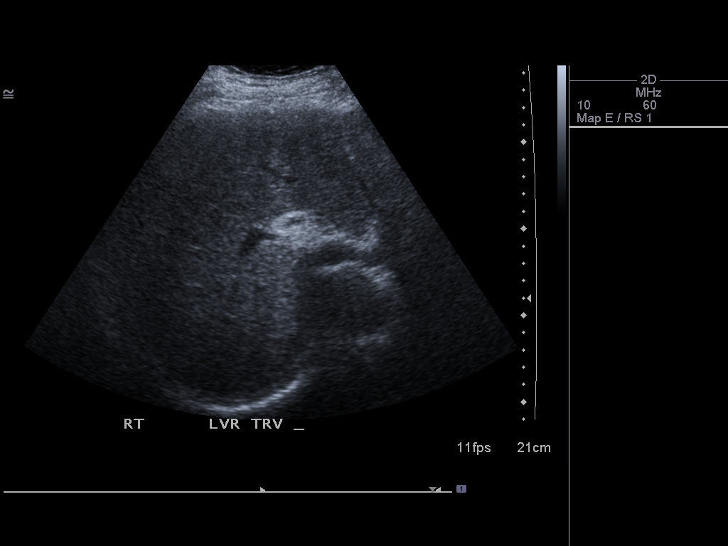
[im 36/107]
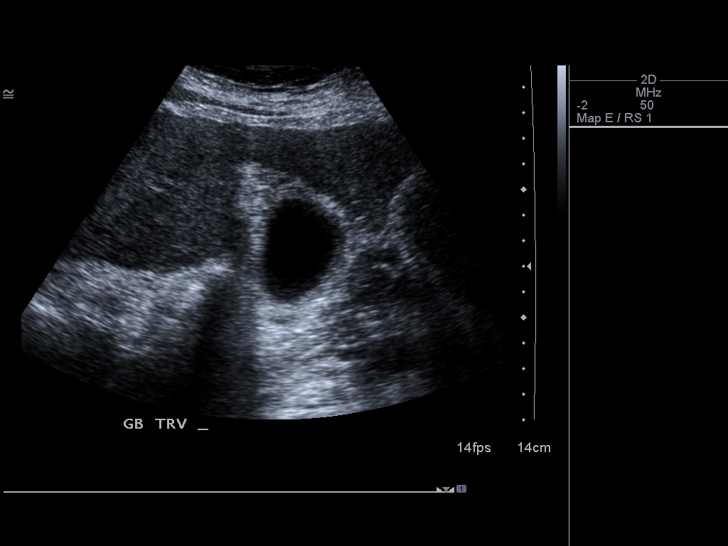
[im 45/107]
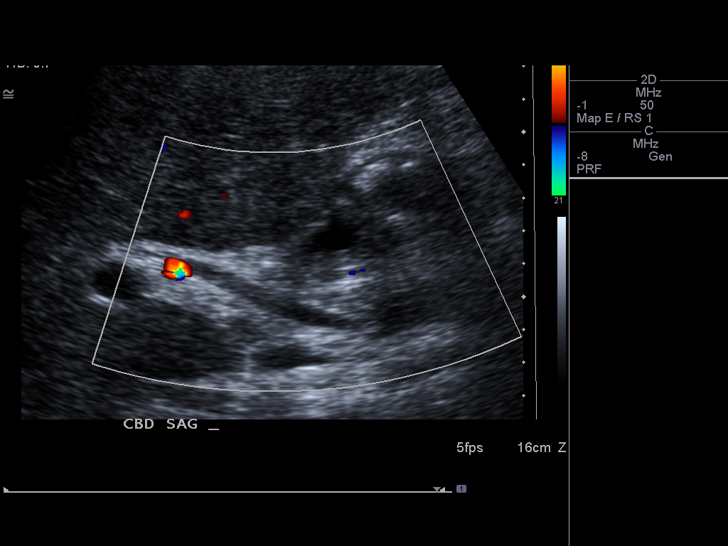
[im 54/107]
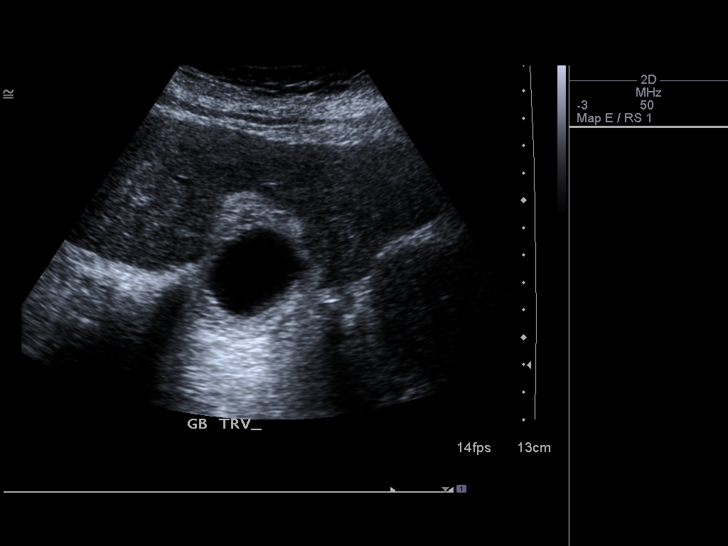
[im 62/107]
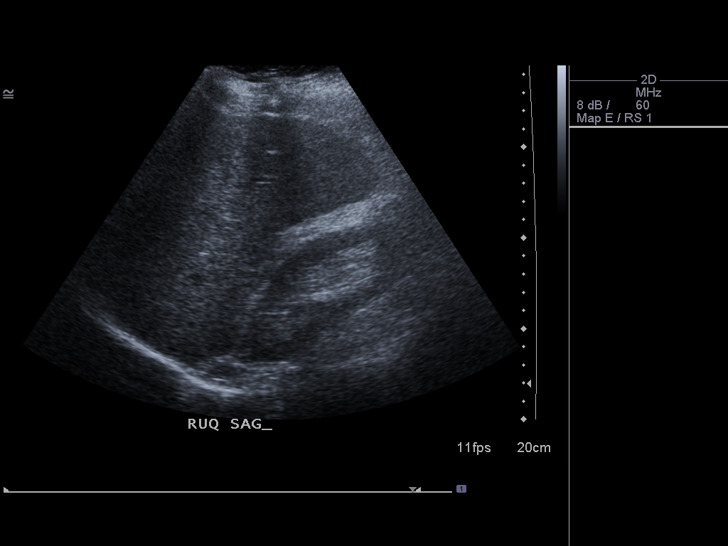
[im 71/107]
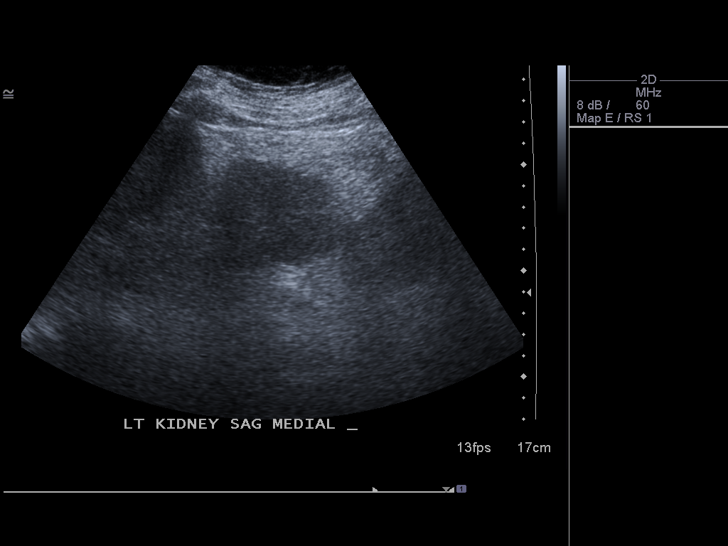
[im 80/107]
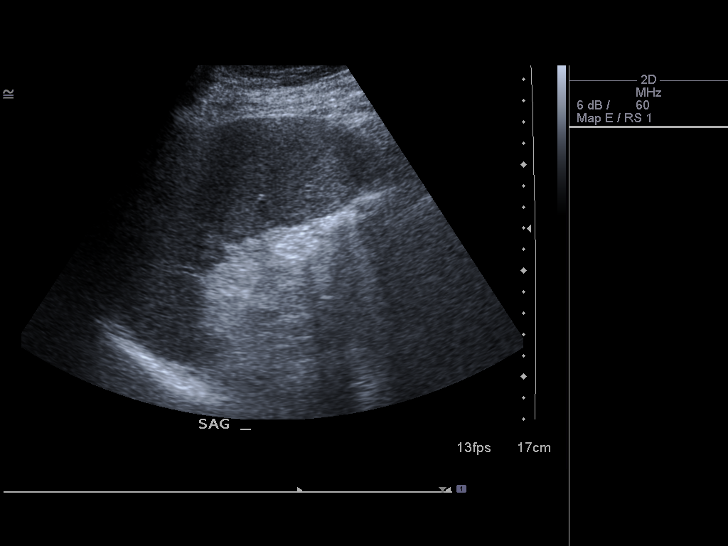
[im 89/107]
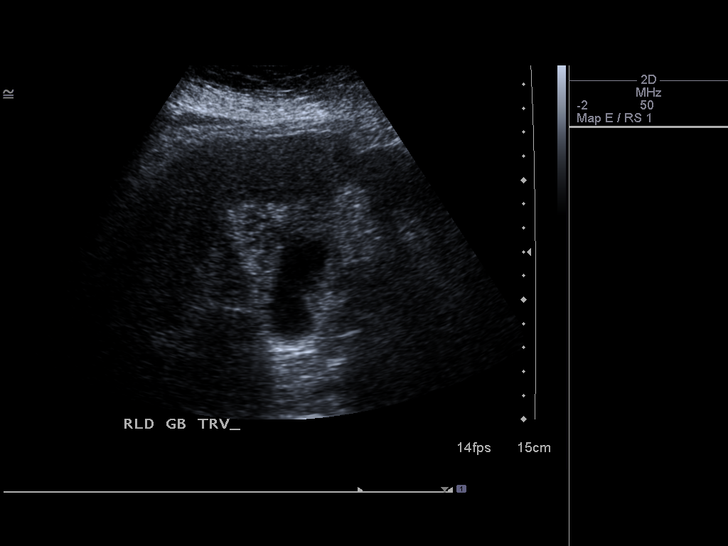
[im 98/107]
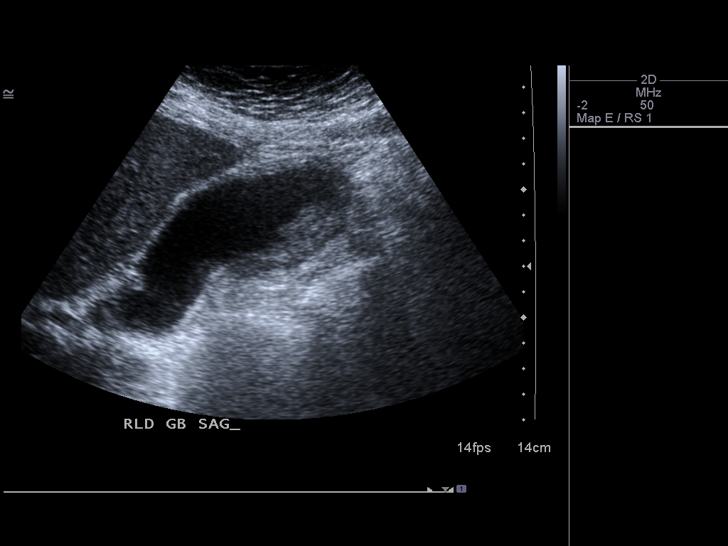
[im 107/107]
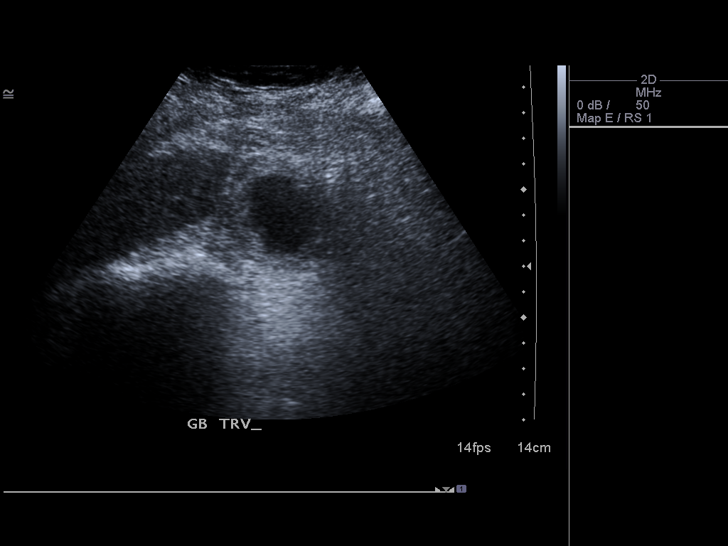

[13 of 25 positions shown; findings below may reference images not displayed]

FINDINGS: Gallbladder:  No gallbladder wall thickening, circumferential but
most pronounced along the interface with the liver.  Wall thickness
up to 7 mm.  Immobile dependent echogenic material could represent
nonshadowing stones or tumefactive sludge.  No sonographic Murphy's
sign elicited.  No definite pericholecystic fluid.

Common bile duct:  Normal measuring 4 mm diameter.

Liver:  Nodular contour.  Heterogeneous echotexture.  No focal
liver lesion.  Mild hepatomegaly, 20 cm in length.

IVC:  Incompletely visualized due to overlying bowel gas,
visualized portions within normal limits.

Pancreas:  Incompletely visualized due to overlying bowel gas,
visualized portions within normal limits.

Spleen:  Within normal limits measuring 11.1 cm in length.

Right Kidney:  Normal measuring 12.4 cm.

Left Kidney:  Normal measuring 12.3 cm.

Abdominal aorta:  Incompletely visualized due to overlying bowel
gas, visualized portions within normal limits. No aneurysm
identified.

Other findings:  No ascites.
IMPRESSION: 1.  Nodular liver suggestive of cirrhosis.  No focal liver lesion.
2.  Gallbladder wall thickening with nonshadowing stones or
tumefactive sludge.  No sonographic Murphy's sign elicited.  In
light of #1 I favor the wall thickening is due to chronic hepatitis
rather than acute cholecystitis.

## 2012-06-24 ENCOUNTER — Other Ambulatory Visit: Payer: Self-pay | Admitting: Family

## 2012-06-25 NOTE — Telephone Encounter (Signed)
2 week supply sent to pharmacy. Please call pt and arrange follow up.

## 2012-06-25 NOTE — Telephone Encounter (Signed)
OK to give 2 week supply, but then pt needs apt.

## 2012-06-26 ENCOUNTER — Telehealth: Payer: Self-pay | Admitting: *Deleted

## 2012-06-26 NOTE — Telephone Encounter (Addendum)
I will need to see her back in the office before I can adjust her medications.  She can move her appointment up sooner.

## 2012-06-26 NOTE — Telephone Encounter (Signed)
Spoke to patient and informed her that a two week supply has been sent to pharmacy and patient scheduled appointment for 07/02/12

## 2012-06-26 NOTE — Telephone Encounter (Signed)
Pt left message stating her lasix does not seem to be as effective as it has been in the past. Reports that legs are still swelling and is wanting to know if the dose can be increased or if she should try something different?  Pt has f/u with [facility] on 07/18/12 and f/u with [place] on 07/02/12.  Please advise.

## 2012-06-27 NOTE — Telephone Encounter (Signed)
Notified pt. She reports that she will continue her current dose until her follow up on 07/02/12 as she is not able to reschedule appt at this time.

## 2012-07-02 ENCOUNTER — Ambulatory Visit (INDEPENDENT_AMBULATORY_CARE_PROVIDER_SITE_OTHER): Payer: PRIVATE HEALTH INSURANCE | Admitting: Family

## 2012-07-02 ENCOUNTER — Encounter: Payer: Self-pay | Admitting: Family

## 2012-07-02 VITALS — BP 118/82 | HR 93 | Temp 98.7°F | Resp 18 | Wt 260.0 lb

## 2012-07-02 DIAGNOSIS — B182 Chronic viral hepatitis C: Secondary | ICD-10-CM

## 2012-07-02 DIAGNOSIS — R609 Edema, unspecified: Secondary | ICD-10-CM

## 2012-07-02 DIAGNOSIS — E039 Hypothyroidism, unspecified: Secondary | ICD-10-CM

## 2012-07-02 DIAGNOSIS — R011 Cardiac murmur, unspecified: Secondary | ICD-10-CM

## 2012-07-02 DIAGNOSIS — B1920 Unspecified viral hepatitis C without hepatic coma: Secondary | ICD-10-CM

## 2012-07-02 MED ORDER — FUROSEMIDE 40 MG PO TABS: 40.0000 mg | ORAL_TABLET | Freq: Two times a day (BID) | ORAL | Status: DC

## 2012-07-02 NOTE — Patient Instructions (Addendum)
Please keep your upcoming appointment at [facility]. Complete your lab work prior to leaving.  Follow up in 1 week.  You will be contact about your echocardiogram.  Please let us know if you have not heard back within 1 week about your referral.
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
## 2012-07-03 ENCOUNTER — Telehealth: Payer: Self-pay | Admitting: Family

## 2012-07-03 DIAGNOSIS — B182 Chronic viral hepatitis C: Secondary | ICD-10-CM | POA: Insufficient documentation

## 2012-07-03 NOTE — Assessment & Plan Note (Signed)
I suspect continue decline in her liver function given clinical presentation.  I believe she has very poor insight on her disease and state of health.  I reviewed with her her liver function testing over the last 3 years and highlighted the recent deterioration of her liver function.  I instructed her to complete lab work today, keep upcoming appointment with [facility].  Edema likely related to hypoalbuminemia. Restart lasix bid with close monitoring of renal function.

## 2012-07-03 NOTE — Assessment & Plan Note (Signed)
She has been non-compliant with synthroid. We discussed that hypothyroidism can also contribute to her edema and we discussed the importance of compliance with synthroid.

## 2012-07-03 NOTE — Telephone Encounter (Signed)
Pt has echocardiogram scheduled for tomorrow 08.21.13 & will come in to our office for labs before that appointment/SLS

## 2012-07-03 NOTE — Telephone Encounter (Signed)
Pls call pt and ask her if she completed lab work yesterday.  If not I would like her to complete today.  It is very important.  Thanks.

## 2012-07-04 ENCOUNTER — Ambulatory Visit (HOSPITAL_BASED_OUTPATIENT_CLINIC_OR_DEPARTMENT_OTHER)
Admission: RE | Admit: 2012-07-04 | Discharge: 2012-07-04 | Disposition: A | Discharge status: Home / Self Care | Payer: No Typology Code available for payment source | Source: Ambulatory Visit | Attending: Family | Admitting: Family

## 2012-07-04 DIAGNOSIS — B1920 Unspecified viral hepatitis C without hepatic coma: Secondary | ICD-10-CM | POA: Insufficient documentation

## 2012-07-04 DIAGNOSIS — R011 Cardiac murmur, unspecified: Secondary | ICD-10-CM

## 2012-07-04 DIAGNOSIS — R609 Edema, unspecified: Secondary | ICD-10-CM | POA: Insufficient documentation

## 2012-07-04 DIAGNOSIS — F17200 Nicotine dependence, unspecified, uncomplicated: Secondary | ICD-10-CM | POA: Insufficient documentation

## 2012-07-04 DIAGNOSIS — I079 Rheumatic tricuspid valve disease, unspecified: Secondary | ICD-10-CM | POA: Insufficient documentation

## 2012-07-04 LAB — HEPATIC FUNCTION PANEL
AST: 95 U/L — ABNORMAL HIGH (ref 0–37)
Bilirubin, Direct: 1.3 mg/dL — ABNORMAL HIGH (ref 0.0–0.3)
Indirect Bilirubin: 1.3 mg/dL — ABNORMAL HIGH (ref 0.0–0.9)
Total Bilirubin: 2.6 mg/dL — ABNORMAL HIGH (ref 0.3–1.2)

## 2012-07-04 LAB — PROTIME-INR
INR: 1.71 — ABNORMAL HIGH (ref <1.50)
Prothrombin Time: 20.7 seconds — ABNORMAL HIGH (ref 11.6–15.2)

## 2012-07-04 LAB — BASIC METABOLIC PANEL WITH GFR
CO2: 28 mEq/L (ref 19–32)
Calcium: 8.3 mg/dL — ABNORMAL LOW (ref 8.4–10.5)
Potassium: 3.2 mEq/L — ABNORMAL LOW (ref 3.5–5.3)
Sodium: 133 mEq/L — ABNORMAL LOW (ref 135–145)

## 2012-07-04 NOTE — Progress Notes (Signed)
  Echocardiogram 2D Echocardiogram has been performed.  Margreta Journey 07/04/2012, 10:01 AM

## 2012-07-07 ENCOUNTER — Telehealth: Payer: Self-pay | Admitting: Family

## 2012-07-07 MED ORDER — SPIRONOLACTONE 50 MG PO TABS: 25.0000 mg | ORAL_TABLET | Freq: Two times a day (BID) | ORAL | Status: DC

## 2012-07-10 NOTE — Telephone Encounter (Signed)
Late entry.  Case was reviewed with Dr. Rodena Medin.  Plan to add Aldactone to her regimen.  Important that she get established with [facility] as she may need to be placed on transplant list with current decline in liver function.  Spoke to Tiffany Adkins, reviewed labs, plans to start aldactone and importance of keeping apt with [facility] next week.  She also admits to recent heavy alcohol use (3 drinks a night). Though reports that she has cut back to 1 drink a night the last few weeks.  I advised her to abstain completely from alcohol. Tiffany Adkins verbalizes understanding.

## 2012-07-11 ENCOUNTER — Encounter: Payer: Self-pay | Admitting: Family

## 2012-07-11 ENCOUNTER — Ambulatory Visit (INDEPENDENT_AMBULATORY_CARE_PROVIDER_SITE_OTHER): Payer: PRIVATE HEALTH INSURANCE | Admitting: Family

## 2012-07-11 VITALS — BP 102/76 | HR 84 | Temp 98.0°F | Resp 16 | Wt 245.0 lb

## 2012-07-11 DIAGNOSIS — D509 Iron deficiency anemia, unspecified: Secondary | ICD-10-CM

## 2012-07-11 DIAGNOSIS — B182 Chronic viral hepatitis C: Secondary | ICD-10-CM

## 2012-07-11 DIAGNOSIS — E876 Hypokalemia: Secondary | ICD-10-CM

## 2012-07-11 DIAGNOSIS — D649 Anemia, unspecified: Secondary | ICD-10-CM

## 2012-07-11 DIAGNOSIS — E611 Iron deficiency: Secondary | ICD-10-CM

## 2012-07-11 LAB — CBC WITH DIFFERENTIAL/PLATELET
HCT: 29.4 % — ABNORMAL LOW (ref 36.0–46.0)
Hemoglobin: 9.4 g/dL — ABNORMAL LOW (ref 12.0–15.0)
Lymphocytes Relative: 31 % (ref 12–46)
Monocytes Absolute: 0.7 10*3/uL (ref 0.1–1.0)
Monocytes Relative: 12 % (ref 3–12)
Neutro Abs: 3.2 10*3/uL (ref 1.7–7.7)
WBC: 6 10*3/uL (ref 4.0–10.5)
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
## 2012-07-11 NOTE — Patient Instructions (Addendum)
Please keep your upcoming appointment at [facility] on 9/4. Follow up in 3 weeks here. Call if increased swelling, if you weight drops below 235. (weigh yourself daily)

## 2012-07-11 NOTE — Assessment & Plan Note (Signed)
Obtain cbc, iron level.

## 2012-07-11 NOTE — Progress Notes (Signed)
  Subjective:    Patient ID: Tiffany Adkins, female    DOB: 1966-08-02, 46 y.o.   MRN: [id number]  HPI    Review of Systems     Objective:   Physical Exam        Assessment & Plan:  I have advised pt to bring copies of her records from Dr. Baltazar Najjar office to her appointment at [facility] so that they can review her previous treatment and testing results.

## 2012-07-11 NOTE — Assessment & Plan Note (Signed)
Edema and ascites is improved with diuresis.  Continue same.  Pt instructed to keep close eye on her weight at home and let us know if she loses more than 10 more pounds.  She is to keep upcoming apt at [facility].

## 2012-07-12 LAB — BASIC METABOLIC PANEL WITH GFR
BUN: 5 mg/dL — ABNORMAL LOW (ref 6–23)
Chloride: 100 mEq/L (ref 96–112)
GFR, Est Non African American: >89 mL/min
Potassium: 3.2 mEq/L — ABNORMAL LOW (ref 3.5–5.3)

## 2012-07-13 ENCOUNTER — Telehealth: Payer: Self-pay | Admitting: Family

## 2012-07-13 MED ORDER — POTASSIUM CHLORIDE CRYS ER 20 MEQ PO TBCR: 40.0000 meq | EXTENDED_RELEASE_TABLET | Freq: Every day | ORAL | Status: DC

## 2012-07-13 NOTE — Telephone Encounter (Signed)
Spoke to pt, reviewed lab work.  Instructed her to increase potassium to 2 tabs ( ) daily and to follow up in 3 weeks as scheduled.  Also instructed pt to resume iron.

## 2012-07-14 ENCOUNTER — Other Ambulatory Visit: Payer: Self-pay | Admitting: Family

## 2012-08-03 ENCOUNTER — Telehealth: Payer: Self-pay | Admitting: *Deleted

## 2012-08-03 NOTE — Telephone Encounter (Signed)
Received fax from [facility] stating they provide health care management services to the [employer] and makes recommendations regarding the medical necessityu and appropriateness of treatment. They are requesting: H&P of patient's medical condition, xray, path/lab results, office notes and discharge summary from 04/14/12 through 07/27/12. Left message for pt to return my call. Need to ask pt to sign a record's release and ask if pt has gotten in with specialist at [facility].

## 2012-08-07 NOTE — Telephone Encounter (Signed)
Notified pt, she requests a copy of letter be faxed to her at [phone]. Letter faxed. Pt reports that she has seen Dr Pollyann Samples and he was supposed to send [place] some information re: her visit.  Pt scheduled follow up with [place] on 08/10/12 at 4pm.

## 2012-08-10 ENCOUNTER — Encounter: Payer: Self-pay | Admitting: Family

## 2012-08-10 ENCOUNTER — Ambulatory Visit (INDEPENDENT_AMBULATORY_CARE_PROVIDER_SITE_OTHER): Payer: PRIVATE HEALTH INSURANCE | Admitting: Family

## 2012-08-10 VITALS — BP 112/70 | HR 81 | Temp 98.6°F | Resp 16 | Wt 243.0 lb

## 2012-08-10 DIAGNOSIS — Z0000 Encounter for general adult medical examination without abnormal findings: Secondary | ICD-10-CM

## 2012-08-10 DIAGNOSIS — M7660 Achilles tendinitis, unspecified leg: Secondary | ICD-10-CM

## 2012-08-10 DIAGNOSIS — Z720 Tobacco use: Secondary | ICD-10-CM

## 2012-08-10 DIAGNOSIS — B182 Chronic viral hepatitis C: Secondary | ICD-10-CM

## 2012-08-10 DIAGNOSIS — F17200 Nicotine dependence, unspecified, uncomplicated: Secondary | ICD-10-CM

## 2012-08-10 DIAGNOSIS — K7460 Unspecified cirrhosis of liver: Secondary | ICD-10-CM

## 2012-08-10 DIAGNOSIS — E876 Hypokalemia: Secondary | ICD-10-CM

## 2012-08-10 MED ORDER — FUROSEMIDE 40 MG PO TABS: 40.0000 mg | ORAL_TABLET | Freq: Two times a day (BID) | ORAL | Status: DC

## 2012-08-10 MED ORDER — TRAMADOL HCL 50 MG PO TABS: 50.0000 mg | ORAL_TABLET | Freq: Three times a day (TID) | ORAL | Status: DC | PRN

## 2012-08-10 MED ORDER — VARENICLINE TARTRATE 0.5 MG X 11 & 1 MG X 42 PO MISC: ORAL | Status: DC
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
## 2012-08-10 NOTE — Patient Instructions (Addendum)
Please schedule a follow up appointment in 2 months.

## 2012-08-11 LAB — BASIC METABOLIC PANEL
CO2: 28 mEq/L (ref 19–32)
Calcium: 8.8 mg/dL (ref 8.4–10.5)
Chloride: 98 mEq/L (ref 96–112)
Creat: 0.97 mg/dL (ref 0.50–1.10)
Sodium: 133 mEq/L — ABNORMAL LOW (ref 135–145)

## 2012-08-12 DIAGNOSIS — Z8619 Personal history of other infectious and parasitic diseases: Secondary | ICD-10-CM | POA: Insufficient documentation

## 2012-08-12 DIAGNOSIS — M7660 Achilles tendinitis, unspecified leg: Secondary | ICD-10-CM | POA: Insufficient documentation

## 2012-08-12 NOTE — Assessment & Plan Note (Addendum)
Pt with pain right achilles tendon.  Refer to sports med, tramadol prn pain. She is aware that this may cause drowsiness and not to drive after taking.

## 2012-08-12 NOTE — Assessment & Plan Note (Addendum)
Following at [facility], undergoing evaluation for transplant list.  Plans to start study drug in January. Needs pap smear per transplant team- refer to GYN. Flu shot today.

## 2012-08-12 NOTE — Assessment & Plan Note (Signed)
Aldactone recently added to her regimen.  Obtain BMET.

## 2012-08-12 NOTE — Assessment & Plan Note (Signed)
Pt counseled on tobacco cessation.  She would like to try chantix.  We discussed common side effects and risks including worsening depression/suicide ideation and she wishes to proceed.  Rx provided.

## 2012-08-13 ENCOUNTER — Encounter: Payer: Self-pay | Admitting: Family

## 2012-08-14 ENCOUNTER — Ambulatory Visit: Payer: PRIVATE HEALTH INSURANCE | Admitting: Family Medicine

## 2012-08-17 ENCOUNTER — Ambulatory Visit (INDEPENDENT_AMBULATORY_CARE_PROVIDER_SITE_OTHER): Payer: PRIVATE HEALTH INSURANCE | Admitting: Family Medicine

## 2012-08-17 VITALS — BP 122/75 | HR 83 | Ht 67.0 in | Wt 235.0 lb

## 2012-08-17 DIAGNOSIS — M25579 Pain in unspecified ankle and joints of unspecified foot: Secondary | ICD-10-CM

## 2012-08-17 DIAGNOSIS — M25571 Pain in right ankle and joints of right foot: Secondary | ICD-10-CM

## 2012-08-17 MED ORDER — TRAMADOL HCL 50 MG PO TABS: 50.0000 mg | ORAL_TABLET | Freq: Two times a day (BID) | ORAL | Status: DC

## 2012-08-17 NOTE — Patient Instructions (Addendum)
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 Tramadol as needed for pain. Calf raise exercises 3 sets of 10 once a day - when you can do this on both feet, ok to start them one legged. Achilles stretch as shown 3 sets once a day for 20-30 seconds. Can add heel walks, toe walks forward and backward as well Ice bucket 10-15 minutes at end of day - can ice 3-4 times a day. Avoid uneven ground, hills. Heel lifts in shoes to help unload area. OTC orthotics with heel lift may be helpful. Physical therapy, nitro patches are considerations if you're not improving with the above protocol. Follow up with me in 5-6 weeks.

## 2012-08-20 ENCOUNTER — Encounter: Payer: Self-pay | Admitting: Family Medicine

## 2012-08-20 DIAGNOSIS — M25571 Pain in right ankle and joints of right foot: Secondary | ICD-10-CM | POA: Insufficient documentation

## 2012-08-20 NOTE — Assessment & Plan Note (Signed)
2/2 achilles tendinopathy.  Shown home exercise program and handout provided.  Heel lifts to help unload area.  Consider orthotics, formal PT, nitro patches if not improving.  Has tramadol as needed - refilled this medication.  Avoid tylenol and nsaids.  Icing as needed.  Avoid uneven ground, hills.
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
## 2012-08-30 ENCOUNTER — Other Ambulatory Visit: Payer: Self-pay | Admitting: Family

## 2012-08-31 NOTE — Telephone Encounter (Signed)
Please see warning and advise re: refills of each:  Potassium Preparations / Potassium-Sparing Diuretics Co-administration of Potassium Preparations and Potassium-Sparing Diuretics may cause hyperkalemia, possibly leading to cardiac arrhythmias or cardiac arrest.     spironolactone (ALDACTONE) 50 MG tablet [Pharmacy Med Name: SPIRONOLACTONE 50 MG TABLET] KLOR-CON M20 20 MEQ tablet [Pharmacy Med Name: KLOR-CON ** M ** 20MEQ]

## 2012-08-31 NOTE — Telephone Encounter (Signed)
OK to send #30 with 2 refill please.  Warning noted.

## 2012-08-31 NOTE — Telephone Encounter (Signed)
Pt also due for a follow up in Nov.

## 2012-09-05 ENCOUNTER — Telehealth: Payer: Self-pay | Admitting: *Deleted

## 2012-09-05 NOTE — Telephone Encounter (Signed)
Pt called returning call from Elba Barman.

## 2012-09-05 NOTE — Telephone Encounter (Signed)
Called patient back and stated that Tiffany Adkins wanted to see her sometime in November. Patient said that she will have to call back to schedule appointment.

## 2012-09-25 ENCOUNTER — Telehealth: Payer: Self-pay | Admitting: Family

## 2012-09-25 NOTE — Telephone Encounter (Signed)
I did not make appointment.  Likely the transplant clinic at [facility] made appointment.  OK to send recent office notes though please.

## 2012-09-25 NOTE — Telephone Encounter (Signed)
Message copied by Sandford Craze on Tue Sep 25, 2012 12:43 PM ------      Message from: Eulah Pont      Created: Tue Sep 25, 2012 12:29 PM       Patient seeing Dr Willette Cluster @ [facility] tomorrow ,they stated you made appt.   Want notes faxed

## 2012-09-26 ENCOUNTER — Other Ambulatory Visit: Payer: Self-pay | Admitting: Family

## 2012-09-26 DIAGNOSIS — Z1231 Encounter for screening mammogram for malignant neoplasm of breast: Secondary | ICD-10-CM

## 2012-09-28 ENCOUNTER — Ambulatory Visit: Payer: PRIVATE HEALTH INSURANCE | Admitting: Family Medicine

## 2012-10-05 ENCOUNTER — Ambulatory Visit (HOSPITAL_BASED_OUTPATIENT_CLINIC_OR_DEPARTMENT_OTHER)
Admission: RE | Admit: 2012-10-05 | Discharge: 2012-10-05 | Disposition: A | Discharge status: Home / Self Care | Payer: PRIVATE HEALTH INSURANCE | Source: Ambulatory Visit | Attending: Family | Admitting: Family

## 2012-10-05 DIAGNOSIS — Z1231 Encounter for screening mammogram for malignant neoplasm of breast: Secondary | ICD-10-CM

## 2012-10-14 ENCOUNTER — Other Ambulatory Visit: Payer: Self-pay | Admitting: Family

## 2012-10-15 NOTE — Telephone Encounter (Signed)
Rx to pharmacy/SLS 

## 2012-10-18 ENCOUNTER — Other Ambulatory Visit: Payer: Self-pay | Admitting: Family

## 2012-10-18 NOTE — Telephone Encounter (Signed)
Potassium refill sent to pharmacy. Please call pt and let her know that she was due for a follow up in November. We will need to see her within 1 month as we will not be able to provider further refills until she is seen in the office.

## 2012-10-18 NOTE — Telephone Encounter (Signed)
Spoke to patient and informed her of medication refill. Patient scheduled appointment for 10/26/12

## 2012-10-24 ENCOUNTER — Telehealth: Payer: Self-pay | Admitting: *Deleted

## 2012-10-24 NOTE — Telephone Encounter (Signed)
Received message from pt on 10/23/12 stating her doctor at [facility] is wanting her to complete a TB skin test and DEXA scan. She wants to know if we can order these locally or if she will need to return to [facility] to complete. Left detailed message on cell# that we can order testing locally if ok with [facility]. Advised pt this will need to be discussed with her Provider at her upcoming appt on 10/26/12.

## 2012-10-26 ENCOUNTER — Encounter: Payer: Self-pay | Admitting: Family

## 2012-10-26 ENCOUNTER — Ambulatory Visit (INDEPENDENT_AMBULATORY_CARE_PROVIDER_SITE_OTHER): Payer: PRIVATE HEALTH INSURANCE | Admitting: Family

## 2012-10-26 VITALS — BP 116/78 | HR 88 | Temp 98.0°F | Resp 18 | Ht 67.01 in | Wt 234.1 lb

## 2012-10-26 DIAGNOSIS — B182 Chronic viral hepatitis C: Secondary | ICD-10-CM

## 2012-10-26 DIAGNOSIS — K7460 Unspecified cirrhosis of liver: Secondary | ICD-10-CM

## 2012-10-26 DIAGNOSIS — F1010 Alcohol abuse, uncomplicated: Secondary | ICD-10-CM

## 2012-10-26 MED ORDER — BUPROPION HCL ER (SR) 150 MG PO TB12: 150.0000 mg | ORAL_TABLET | Freq: Two times a day (BID) | ORAL | Status: DC

## 2012-10-26 NOTE — Patient Instructions (Addendum)
You will be contact about your referral to the therapist.  Please let us know if you have not heard back within 1 week about your referral. Please schedule a follow up appointment in 1 month.

## 2012-10-26 NOTE — Progress Notes (Signed)
  Subjective:    Patient ID: Tiffany Adkins, female    DOB: 1966/03/10, 46 y.o.   MRN: [id number]  HPI  Tiffany Adkins is a 46 yr old female who presents today for follow up.  She continues to follow with the transplant clinic since at [facility]. She tells me that she cannot move forward and be placed on the list until she quits smoking and drinking.  She tells me that she has not had a drink in three weeks, but she still is smoking 3 cigarettes a day. She wants to quit. She is requesting a referral to a counselor for help with alcohol/smoking.    Review of Systems    see hpi  Objective:   Physical Exam  Constitutional: She is oriented to person, place, and time. She appears well-developed and well-nourished. No distress.  Cardiovascular: Normal rate and regular rhythm.   No murmur heard. Pulmonary/Chest: Effort normal and breath sounds normal. No respiratory distress. She has no wheezes.  Musculoskeletal:       2+ bilat lower ext edema  Neurological: She is alert and oriented to person, place, and time.  Psychiatric: She has a normal mood and affect. Her behavior is normal. Judgment and thought content normal.          Assessment & Plan:

## 2012-10-26 NOTE — Assessment & Plan Note (Signed)
We discussed the importance of quitting smoking and alcohol. Refer to a therapist. Trial of zyban follow up in 1 month.

## 2012-10-31 ENCOUNTER — Ambulatory Visit: Payer: PRIVATE HEALTH INSURANCE

## 2012-11-02 ENCOUNTER — Inpatient Hospital Stay: Admission: RE | Admit: 2012-11-02 | Payer: PRIVATE HEALTH INSURANCE | Source: Ambulatory Visit

## 2012-11-02 ENCOUNTER — Telehealth: Payer: Self-pay | Admitting: *Deleted

## 2012-11-02 DIAGNOSIS — Z0000 Encounter for general adult medical examination without abnormal findings: Secondary | ICD-10-CM

## 2012-11-02 NOTE — Telephone Encounter (Signed)
Received call from radiology that pt was there for DEXA and order needs to be entered. Order entered.

## 2012-11-05 ENCOUNTER — Telehealth: Payer: Self-pay | Admitting: *Deleted

## 2012-11-05 NOTE — Telephone Encounter (Signed)
Received message from pt date 11/02/12 stating her insurance is requiring her to get prior auth before they will approve Wellbutrin.  Spoke with pharmacist, he states claim does not say doctor should call for authorization so he is going to call the insurance company and find out what needs to be done. Reports that he will fax [place] if prior Berkley Harvey is required.

## 2012-11-05 NOTE — Telephone Encounter (Signed)
Approval received, faxed to pharmacy at [phone]

## 2012-11-05 NOTE — Telephone Encounter (Signed)
Prior Authorization request from pharmacy for Bupropion to [facility]; PA form completed & faxed to [facility] for approval at [id number]

## 2012-11-25 ENCOUNTER — Other Ambulatory Visit: Payer: Self-pay | Admitting: Family

## 2012-11-27 ENCOUNTER — Ambulatory Visit (INDEPENDENT_AMBULATORY_CARE_PROVIDER_SITE_OTHER): Payer: PRIVATE HEALTH INSURANCE | Admitting: Licensed Clinical Social Worker

## 2012-11-27 DIAGNOSIS — F321 Major depressive disorder, single episode, moderate: Secondary | ICD-10-CM

## 2012-12-02 ENCOUNTER — Other Ambulatory Visit: Payer: Self-pay | Admitting: Family

## 2012-12-03 NOTE — Telephone Encounter (Signed)
Rx to pharmacy/SLS 

## 2012-12-10 ENCOUNTER — Other Ambulatory Visit: Payer: Self-pay | Admitting: Family

## 2012-12-12 NOTE — Telephone Encounter (Signed)
Informed patient of medication refill and she scheduled a follow up appointment for 12/21/12

## 2012-12-12 NOTE — Telephone Encounter (Signed)
30 day supply of spironolactone sent to [facility]. Pt was due for a 1 month f/u in this month. Please call pt to arrange appt.

## 2012-12-13 ENCOUNTER — Ambulatory Visit: Payer: PRIVATE HEALTH INSURANCE | Admitting: Licensed Clinical Social Worker

## 2012-12-21 ENCOUNTER — Ambulatory Visit (INDEPENDENT_AMBULATORY_CARE_PROVIDER_SITE_OTHER): Payer: PRIVATE HEALTH INSURANCE | Admitting: Family

## 2012-12-21 VITALS — BP 100/60 | HR 89 | Temp 97.8°F | Resp 16 | Ht 67.0 in | Wt 227.1 lb

## 2012-12-21 DIAGNOSIS — F17200 Nicotine dependence, unspecified, uncomplicated: Secondary | ICD-10-CM

## 2012-12-21 DIAGNOSIS — B182 Chronic viral hepatitis C: Secondary | ICD-10-CM

## 2012-12-21 DIAGNOSIS — K7460 Unspecified cirrhosis of liver: Secondary | ICD-10-CM
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
## 2012-12-21 NOTE — Patient Instructions (Addendum)
Please schedule a follow up appointment in 3 months.

## 2012-12-22 NOTE — Assessment & Plan Note (Signed)
She has cut back since starting the zyban.  Tolerating without difficulty. Continue same, continue to work toward complete cessation.

## 2012-12-22 NOTE — Assessment & Plan Note (Signed)
She is tolerating new treatment regimen per [facility], defer management to Hepatology.

## 2012-12-25 ENCOUNTER — Ambulatory Visit: Payer: PRIVATE HEALTH INSURANCE | Admitting: Licensed Clinical Social Worker

## 2013-01-24 ENCOUNTER — Other Ambulatory Visit: Payer: Self-pay | Admitting: Family

## 2013-01-31 ENCOUNTER — Other Ambulatory Visit: Payer: Self-pay | Admitting: Family

## 2013-02-01 NOTE — Telephone Encounter (Signed)
Rx request to pharmacy/SLS  

## 2013-02-15 ENCOUNTER — Other Ambulatory Visit: Payer: Self-pay | Admitting: Family

## 2013-02-15 NOTE — Telephone Encounter (Signed)
Pt called for her Wellbutrin refill   Has call pharmacy

## 2013-02-27 ENCOUNTER — Other Ambulatory Visit: Payer: Self-pay | Admitting: Family

## 2013-02-27 NOTE — Telephone Encounter (Signed)
Rx request to pharmacy/SLS  

## 2013-03-07 ENCOUNTER — Other Ambulatory Visit: Payer: Self-pay | Admitting: Family

## 2013-04-12 ENCOUNTER — Telehealth: Payer: Self-pay | Admitting: *Deleted

## 2013-04-12 NOTE — Telephone Encounter (Signed)
Noted  

## 2013-04-12 NOTE — Telephone Encounter (Signed)
Received call from pt stating she had vomiting all day on Wednesday, has felt feverish, dizzy and weak since then. She has spoken with nurse at Hep C clinic that advised pt to be evaluated in urgent care as they didn't think it was related to her current medication regimen. Advised pt that no appts are available in the office today and advised care at [facility] in [place].

## 2013-04-17 ENCOUNTER — Ambulatory Visit (INDEPENDENT_AMBULATORY_CARE_PROVIDER_SITE_OTHER): Payer: PRIVATE HEALTH INSURANCE | Admitting: Family

## 2013-04-17 ENCOUNTER — Ambulatory Visit (HOSPITAL_BASED_OUTPATIENT_CLINIC_OR_DEPARTMENT_OTHER)
Admission: RE | Admit: 2013-04-17 | Discharge: 2013-04-17 | Disposition: A | Discharge status: Home / Self Care | Payer: No Typology Code available for payment source | Source: Ambulatory Visit | Attending: Family | Admitting: Family

## 2013-04-17 ENCOUNTER — Encounter: Payer: Self-pay | Admitting: Family

## 2013-04-17 ENCOUNTER — Ambulatory Visit: Payer: PRIVATE HEALTH INSURANCE | Admitting: Family

## 2013-04-17 VITALS — BP 106/60 | HR 88 | Temp 97.8°F | Resp 16 | Ht 67.0 in | Wt 227.0 lb

## 2013-04-17 DIAGNOSIS — R112 Nausea with vomiting, unspecified: Secondary | ICD-10-CM | POA: Insufficient documentation

## 2013-04-17 DIAGNOSIS — K5900 Constipation, unspecified: Secondary | ICD-10-CM | POA: Insufficient documentation

## 2013-04-17 LAB — HEPATIC FUNCTION PANEL
ALT: 23 U/L (ref 0–35)
Albumin: 2.6 g/dL — ABNORMAL LOW (ref 3.5–5.2)
Total Protein: 7.2 g/dL (ref 6.0–8.3)

## 2013-04-17 LAB — BASIC METABOLIC PANEL WITH GFR
CO2: 28 mEq/L (ref 19–32)
Chloride: 92 mEq/L — ABNORMAL LOW (ref 96–112)
Creat: 1.87 mg/dL — ABNORMAL HIGH (ref 0.50–1.10)
GFR, Est Non African American: 32 mL/min — ABNORMAL LOW
Potassium: 3.4 mEq/L — ABNORMAL LOW (ref 3.5–5.3)
Sodium: 130 mEq/L — ABNORMAL LOW (ref 135–145)

## 2013-04-17 LAB — CBC WITH DIFFERENTIAL/PLATELET
Basophils Absolute: 0 10*3/uL (ref 0.0–0.1)
Basophils Relative: 0 % (ref 0–1)
Eosinophils Absolute: 0.1 10*3/uL (ref 0.0–0.7)
Eosinophils Relative: 1 % (ref 0–5)
HCT: 29.8 % — ABNORMAL LOW (ref 36.0–46.0)
MCH: 30.7 pg (ref 26.0–34.0)
MCHC: 34.6 g/dL (ref 30.0–36.0)
MCV: 89 fL (ref 78.0–100.0)
Monocytes Absolute: 0.9 10*3/uL (ref 0.1–1.0)
Neutro Abs: 7.6 10*3/uL (ref 1.7–7.7)
RDW: 17.8 % — ABNORMAL HIGH (ref 11.5–15.5)

## 2013-04-17 MED ORDER — ONDANSETRON HCL 4 MG PO TABS: 4.0000 mg | ORAL_TABLET | Freq: Three times a day (TID) | ORAL | Status: DC | PRN

## 2013-04-17 NOTE — Patient Instructions (Signed)
Please complete your lab work prior to leaving. Complete abdominal X ray on the first floor. Follow up on Friday 6/6. Go to ER if you develop abdominal pain, or if you are unable to keep down liquids/medicines.

## 2013-04-17 NOTE — Assessment & Plan Note (Signed)
?   Gastroenteritis, ? Constipation, ? SBO- will obtain bmet, lft, cbc and stat KUB. Add zofran PRN.  Pt to follow up in our office in 48 hours, go to ER if abdominal pain or if unable to keep down food/liquids.  She verbalizes understanding.
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
## 2013-04-18 ENCOUNTER — Encounter (HOSPITAL_BASED_OUTPATIENT_CLINIC_OR_DEPARTMENT_OTHER): Payer: Self-pay | Admitting: *Deleted

## 2013-04-18 ENCOUNTER — Emergency Department (HOSPITAL_BASED_OUTPATIENT_CLINIC_OR_DEPARTMENT_OTHER)
Admission: EM | Admit: 2013-04-18 | Discharge: 2013-04-18 | Disposition: A | Discharge status: Home / Self Care | Payer: No Typology Code available for payment source | Attending: Emergency Medicine | Admitting: Emergency Medicine

## 2013-04-18 ENCOUNTER — Telehealth: Payer: Self-pay | Admitting: Family

## 2013-04-18 DIAGNOSIS — Z87891 Personal history of nicotine dependence: Secondary | ICD-10-CM | POA: Insufficient documentation

## 2013-04-18 DIAGNOSIS — E669 Obesity, unspecified: Secondary | ICD-10-CM | POA: Insufficient documentation

## 2013-04-18 DIAGNOSIS — E876 Hypokalemia: Secondary | ICD-10-CM | POA: Insufficient documentation

## 2013-04-18 DIAGNOSIS — Z79899 Other long term (current) drug therapy: Secondary | ICD-10-CM | POA: Insufficient documentation

## 2013-04-18 DIAGNOSIS — N390 Urinary tract infection, site not specified: Secondary | ICD-10-CM | POA: Insufficient documentation

## 2013-04-18 DIAGNOSIS — R7982 Elevated C-reactive protein (CRP): Secondary | ICD-10-CM | POA: Insufficient documentation

## 2013-04-18 DIAGNOSIS — Z8619 Personal history of other infectious and parasitic diseases: Secondary | ICD-10-CM | POA: Insufficient documentation

## 2013-04-18 DIAGNOSIS — IMO0001 Reserved for inherently not codable concepts without codable children: Secondary | ICD-10-CM | POA: Insufficient documentation

## 2013-04-18 HISTORY — DX: Unspecified viral hepatitis C without hepatic coma: B19.20

## 2013-04-18 LAB — HEPATIC FUNCTION PANEL
Alkaline Phosphatase: 109 U/L (ref 39–117)
Indirect Bilirubin: 2.4 mg/dL — ABNORMAL HIGH (ref 0.3–0.9)
Total Protein: 8 g/dL (ref 6.0–8.3)

## 2013-04-18 LAB — CBC WITH DIFFERENTIAL/PLATELET
Basophils Absolute: 0 10*3/uL (ref 0.0–0.1)
Eosinophils Absolute: 0.2 10*3/uL (ref 0.0–0.7)
HCT: 31.8 % — ABNORMAL LOW (ref 36.0–46.0)
Lymphs Abs: 0.7 10*3/uL (ref 0.7–4.0)
MCH: 31.7 pg (ref 26.0–34.0)
MCHC: 32.4 g/dL (ref 30.0–36.0)
MCV: 97.8 fL (ref 78.0–100.0)
Monocytes Absolute: 1.3 10*3/uL — ABNORMAL HIGH (ref 0.1–1.0)
Neutro Abs: 8.4 10*3/uL — ABNORMAL HIGH (ref 1.7–7.7)
RDW: 19.1 % — ABNORMAL HIGH (ref 11.5–15.5)

## 2013-04-18 LAB — URINALYSIS, ROUTINE W REFLEX MICROSCOPIC
Ketones, ur: NEGATIVE mg/dL
Protein, ur: NEGATIVE mg/dL
Urobilinogen, UA: 4 mg/dL — ABNORMAL HIGH (ref 0.0–1.0)

## 2013-04-18 LAB — APTT: aPTT: 34 seconds (ref 24–37)

## 2013-04-18 LAB — URINE MICROSCOPIC-ADD ON

## 2013-04-18 LAB — BASIC METABOLIC PANEL
CO2: 28 mEq/L (ref 19–32)
Calcium: 8.8 mg/dL (ref 8.4–10.5)
Creatinine, Ser: 1.1 mg/dL (ref 0.50–1.10)

## 2013-04-18 LAB — CK: Total CK: 22 U/L (ref 7–177)

## 2013-04-18 MED ORDER — CEPHALEXIN 500 MG PO CAPS: 500.0000 mg | ORAL_CAPSULE | Freq: Two times a day (BID) | ORAL | Status: DC

## 2013-04-18 MED ORDER — DEXTROSE 5 % IV SOLN
1.0000 g | Freq: Once | INTRAVENOUS | Status: AC
  Administered 2013-04-18: 1 g via INTRAVENOUS
  Filled 2013-04-18: qty 10

## 2013-04-18 MED ORDER — POTASSIUM CHLORIDE CRYS ER 20 MEQ PO TBCR
40.0000 meq | EXTENDED_RELEASE_TABLET | Freq: Once | ORAL | Status: AC
  Administered 2013-04-18: 40 meq via ORAL
  Filled 2013-04-18: qty 2

## 2013-04-18 NOTE — Telephone Encounter (Signed)
Office note and labs from 04/17/13 were faxed to Dr Sharlene Motts at [phone].

## 2013-04-18 NOTE — ED Notes (Signed)
Weakness for a week. Was seen by her MD yesterday and called at home today for possible kidney failure.

## 2013-04-18 NOTE — Telephone Encounter (Signed)
Pt returned my call.  She will go to the ED for IV fluids.  Reports diarrhea has stopped.

## 2013-04-18 NOTE — Telephone Encounter (Signed)
Left detailed message on cell phone advising pt to go to the ED due to severe dehydration noted on labs.  Left message on work phone with my call back number.
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
## 2013-04-19 ENCOUNTER — Ambulatory Visit (INDEPENDENT_AMBULATORY_CARE_PROVIDER_SITE_OTHER): Payer: PRIVATE HEALTH INSURANCE | Admitting: Family

## 2013-04-19 ENCOUNTER — Encounter: Payer: Self-pay | Admitting: Family

## 2013-04-19 VITALS — BP 107/70 | HR 79 | Temp 97.7°F | Resp 16 | Wt 228.0 lb

## 2013-04-19 DIAGNOSIS — N390 Urinary tract infection, site not specified: Secondary | ICD-10-CM

## 2013-04-19 DIAGNOSIS — A088 Other specified intestinal infections: Secondary | ICD-10-CM

## 2013-04-19 DIAGNOSIS — N289 Disorder of kidney and ureter, unspecified: Secondary | ICD-10-CM

## 2013-04-19 DIAGNOSIS — A084 Viral intestinal infection, unspecified: Secondary | ICD-10-CM

## 2013-04-19 DIAGNOSIS — E876 Hypokalemia: Secondary | ICD-10-CM

## 2013-04-19 LAB — BASIC METABOLIC PANEL
BUN: 22 mg/dL (ref 6–23)
CO2: 28 mEq/L (ref 19–32)
Calcium: 8.1 mg/dL — ABNORMAL LOW (ref 8.4–10.5)
Creat: 1.19 mg/dL — ABNORMAL HIGH (ref 0.50–1.10)
Glucose, Bld: 126 mg/dL — ABNORMAL HIGH (ref 70–99)

## 2013-04-19 NOTE — Patient Instructions (Addendum)
Please complete your lab work prior to leaving. Follow up in 2 months here.  Keep your upcoming appointment with [facility].
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
## 2013-04-20 DIAGNOSIS — N289 Disorder of kidney and ureter, unspecified: Secondary | ICD-10-CM | POA: Insufficient documentation

## 2013-04-20 LAB — URINE CULTURE: Colony Count: >=100000

## 2013-04-20 NOTE — Assessment & Plan Note (Signed)
Culture in ED grew 100K e coli.  Pt instructed to start keflex.

## 2013-04-20 NOTE — Assessment & Plan Note (Signed)
repleted in ED. She is back on potassium supplement.  Continue Kdur, repeat bmet today.

## 2013-04-20 NOTE — Assessment & Plan Note (Signed)
Cr bumped up to 1.8 in setting of dehydration.  Had come down to 1.1 in ED.

## 2013-04-20 NOTE — Assessment & Plan Note (Signed)
Improving.  S/p IV fluids in ED.

## 2013-04-21 ENCOUNTER — Telehealth (HOSPITAL_COMMUNITY): Payer: Self-pay | Admitting: Emergency Medicine

## 2013-04-21 NOTE — ED Notes (Signed)
Post ED Visit - Positive Culture Follow-up  Culture report reviewed by antimicrobial stewardship pharmacist: [x]  Wes Dulaney, Pharm.D., BCPS []  Celedonio Miyamoto, Pharm.D., BCPS []  Georgina Pillion, Pharm.D., BCPS []  [place], [place].D., BCPS, AAHIVP []  Estella Husk, Pharm.D., BCPS, AAHIVP  Positive urine culture Treated with Keflex, organism sensitive to the same and no further patient follow-up is required at this time.  Kylie A Holland 04/21/2013, 5:23 PM

## 2013-04-22 ENCOUNTER — Telehealth: Payer: Self-pay | Admitting: *Deleted

## 2013-04-22 NOTE — Telephone Encounter (Signed)
Message copied by Kathi Simpers on Mon Apr 22, 2013  5:11 PM ------      Message from: O'SULLIVAN, MELISSA      Created: Sun Apr 21, 2013  9:05 AM       Please call pt and advise her to take Kdur 2 tabs today as potassium is still low. She can then return to one tab once daily. ------

## 2013-04-22 NOTE — Telephone Encounter (Signed)
Notified pt. She is currently taking 1 twice a day but states she had not taken any for 1 week prior to this weekend and has since restarted potassium.  Please advise.

## 2013-04-23 NOTE — Telephone Encounter (Signed)
Discuss with patient, med list updated.

## 2013-04-23 NOTE — Telephone Encounter (Signed)
OK take 1 tab PO BID.

## 2013-04-24 ENCOUNTER — Other Ambulatory Visit: Payer: Self-pay | Admitting: Family

## 2013-05-04 ENCOUNTER — Other Ambulatory Visit: Payer: Self-pay | Admitting: Family

## 2013-06-23 ENCOUNTER — Other Ambulatory Visit: Payer: Self-pay | Admitting: Family

## 2013-06-24 NOTE — Telephone Encounter (Signed)
Patient scheduled appointment for 06/28/13

## 2013-06-24 NOTE — Telephone Encounter (Signed)
Pt was due for follow up earlier this month with Melissa. Please call pt to arrange appt.

## 2013-06-28 ENCOUNTER — Ambulatory Visit: Payer: PRIVATE HEALTH INSURANCE | Admitting: Family

## 2013-07-02 ENCOUNTER — Ambulatory Visit: Payer: PRIVATE HEALTH INSURANCE | Admitting: Family

## 2013-07-02 DIAGNOSIS — Z0289 Encounter for other administrative examinations: Secondary | ICD-10-CM

## 2013-08-03 ENCOUNTER — Other Ambulatory Visit: Payer: Self-pay | Admitting: Family

## 2013-08-05 ENCOUNTER — Other Ambulatory Visit: Payer: Self-pay | Admitting: *Deleted

## 2013-08-05 ENCOUNTER — Telehealth: Payer: Self-pay | Admitting: Family

## 2013-08-05 MED ORDER — LEVOTHYROXINE SODIUM 175 MCG PO TABS: ORAL_TABLET | ORAL | Status: DC

## 2013-08-05 NOTE — Telephone Encounter (Signed)
Could you pls contact pt and ask if she requested a med refill? The pharmacy request came in without attached medication. Thanks.

## 2013-08-05 NOTE — Telephone Encounter (Signed)
Medication sent to the pharmacy by [facility].

## 2013-08-05 NOTE — Telephone Encounter (Signed)
Rx request to pharmacy; *PATIENT DUE FOR FOLLOW-UP OFFICE VISIT*/SLS    

## 2013-08-19 ENCOUNTER — Other Ambulatory Visit: Payer: Self-pay | Admitting: Family

## 2013-08-19 NOTE — Telephone Encounter (Signed)
Received this Medication Warning when attempting to refill Rx: Please Advise/SLS  Drug-Drug Interaction Report Potassium Preparations/Potassium-Sparing Diuretics Significance: Major Warning: Co-administration of potassium chloride SA and spironolactone may cause hyperkalemia, possibly leading to cardiac arrhythmias or cardiac arrest.

## 2013-08-20 ENCOUNTER — Telehealth: Payer: Self-pay | Admitting: Family

## 2013-08-20 NOTE — Telephone Encounter (Signed)
Refill- spironolactone 50mg  tablet. Take 1/2 tablet by mouth twice a day. Qty 15 last fill 9.24.14

## 2013-08-20 NOTE — Telephone Encounter (Signed)
Reviewed- ok to send aldactone but pt needs follow up visit please.

## 2013-08-21 ENCOUNTER — Other Ambulatory Visit: Payer: Self-pay | Admitting: *Deleted

## 2013-08-21 MED ORDER — SPIRONOLACTONE 50 MG PO TABS: ORAL_TABLET | ORAL | Status: DC

## 2013-08-21 NOTE — Telephone Encounter (Signed)
Rx request for Spironolactone; received message: Drug-Drug Interaction Report Potassium Preparations / Potassium-Sparing Diuretics Significance: Major Warning: Co-administration of potassium chloride SA and spironolactone may cause hyperkalemia, possibly leading to cardiac arrhythmias or cardiac arrest.  Please Advise/SLS

## 2013-08-21 NOTE — Telephone Encounter (Signed)
Refill sent.

## 2013-08-21 NOTE — Telephone Encounter (Signed)
Refill sent to pharmacy. Pt last seen in June and was due for follow up in August. Please call pt to arrange appt.

## 2013-08-21 NOTE — Telephone Encounter (Signed)
OK to send.

## 2013-08-21 NOTE — Telephone Encounter (Signed)
See previous phone/refill note of 08/21/13.

## 2013-08-26 NOTE — Telephone Encounter (Signed)
Left detailed message informing patient of medication refill and that she is due for an appointment °

## 2013-08-27 NOTE — Telephone Encounter (Signed)
Left detailed message informing patient of medication refill and that she is due for an appointment °

## 2013-08-28 ENCOUNTER — Telehealth: Payer: Self-pay | Admitting: *Deleted

## 2013-08-28 NOTE — Telephone Encounter (Signed)
Informed patient of medication refill and she scheduled appointment for 09/20/13 °

## 2013-08-28 NOTE — Telephone Encounter (Signed)
Received message from pt that refills we have been sending of spironolactone was only for a quantity of #15 and is a short supply as she takes 1/2 pill twice a day. Last refill sent on 08/21/13 was for #30. Left detailed message on pt's cell# to check with pharmacy as quantity should be correct and to call if any questions.

## 2013-09-10 ENCOUNTER — Other Ambulatory Visit: Payer: Self-pay | Admitting: Family

## 2013-09-11 NOTE — Telephone Encounter (Signed)
Rx request to pharmacy/SLS  

## 2013-09-12 ENCOUNTER — Other Ambulatory Visit: Payer: Self-pay | Admitting: Family

## 2013-09-13 NOTE — Telephone Encounter (Signed)
eScribe request for refill on Potassium Chloride/Klor-Con Last filled - 04.16.14, #60x2 Last AEX - 06.06.14 Next AEX - 2 months Per 06.06.14 phone note, pt was to take [2] tablets daily, then return to [1] tablet daily. Please Advise with clarification on instructions/SLS

## 2013-09-16 ENCOUNTER — Other Ambulatory Visit: Payer: Self-pay | Admitting: *Deleted

## 2013-09-16 MED ORDER — POTASSIUM CHLORIDE CRYS ER 20 MEQ PO TBCR: 20.0000 meq | EXTENDED_RELEASE_TABLET | Freq: Every day | ORAL | Status: DC

## 2013-09-16 NOTE — Telephone Encounter (Signed)
Pt has appt on 09/20/13.

## 2013-09-16 NOTE — Telephone Encounter (Signed)
Please call pt and advise her that she is past due for follow up.

## 2013-09-20 ENCOUNTER — Ambulatory Visit (INDEPENDENT_AMBULATORY_CARE_PROVIDER_SITE_OTHER): Payer: No Typology Code available for payment source | Admitting: Family

## 2013-09-20 ENCOUNTER — Encounter: Payer: Self-pay | Admitting: Family

## 2013-09-20 VITALS — BP 120/70 | HR 74 | Temp 97.8°F | Resp 16 | Ht 67.0 in | Wt 235.0 lb

## 2013-09-20 DIAGNOSIS — E876 Hypokalemia: Secondary | ICD-10-CM

## 2013-09-20 DIAGNOSIS — F17200 Nicotine dependence, unspecified, uncomplicated: Secondary | ICD-10-CM

## 2013-09-20 DIAGNOSIS — D649 Anemia, unspecified: Secondary | ICD-10-CM

## 2013-09-20 DIAGNOSIS — Z23 Encounter for immunization: Secondary | ICD-10-CM

## 2013-09-20 DIAGNOSIS — R609 Edema, unspecified: Secondary | ICD-10-CM

## 2013-09-20 DIAGNOSIS — R7309 Other abnormal glucose: Secondary | ICD-10-CM

## 2013-09-20 DIAGNOSIS — B182 Chronic viral hepatitis C: Secondary | ICD-10-CM

## 2013-09-20 DIAGNOSIS — R739 Hyperglycemia, unspecified: Secondary | ICD-10-CM

## 2013-09-20 DIAGNOSIS — K7460 Unspecified cirrhosis of liver: Secondary | ICD-10-CM

## 2013-09-20 DIAGNOSIS — E039 Hypothyroidism, unspecified: Secondary | ICD-10-CM

## 2013-09-20 LAB — CBC WITH DIFFERENTIAL/PLATELET
Basophils Absolute: 0.1 10*3/uL (ref 0.0–0.1)
Basophils Relative: 1 % (ref 0–1)
Eosinophils Relative: 8 % — ABNORMAL HIGH (ref 0–5)
Lymphocytes Relative: 21 % (ref 12–46)
MCHC: 34 g/dL (ref 30.0–36.0)
Neutro Abs: 4.3 10*3/uL (ref 1.7–7.7)
Platelets: 94 10*3/uL — ABNORMAL LOW (ref 150–400)
RDW: 18.8 % — ABNORMAL HIGH (ref 11.5–15.5)
WBC: 7 10*3/uL (ref 4.0–10.5)

## 2013-09-20 LAB — HEMOGLOBIN A1C: Mean Plasma Glucose: 105 mg/dL (ref <117)

## 2013-09-20 LAB — BASIC METABOLIC PANEL WITH GFR
Calcium: 7.9 mg/dL — ABNORMAL LOW (ref 8.4–10.5)
Creat: 1.17 mg/dL — ABNORMAL HIGH (ref 0.50–1.10)
GFR, Est African American: 64 mL/min
GFR, Est Non African American: 56 mL/min — ABNORMAL LOW

## 2013-09-20 LAB — HEPATIC FUNCTION PANEL
Albumin: 2.5 g/dL — ABNORMAL LOW (ref 3.5–5.2)
Total Protein: 8.6 g/dL — ABNORMAL HIGH (ref 6.0–8.3)

## 2013-09-20 MED ORDER — FUROSEMIDE 40 MG PO TABS: ORAL_TABLET | ORAL | Status: DC

## 2013-09-20 MED ORDER — BUPROPION HCL ER (SR) 150 MG PO TB12: ORAL_TABLET | ORAL | Status: DC

## 2013-09-20 MED ORDER — SPIRONOLACTONE 50 MG PO TABS: ORAL_TABLET | ORAL | Status: DC
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
## 2013-09-20 NOTE — Assessment & Plan Note (Signed)
Hep C levels undetectable following treatment.

## 2013-09-20 NOTE — Assessment & Plan Note (Signed)
Related to hypoalbuminemia secondary to cirrhosis. Lasix as potassium allows.

## 2013-09-20 NOTE — Assessment & Plan Note (Signed)
Requesting refill of wellbutrin

## 2013-09-20 NOTE — Patient Instructions (Signed)
Please complete your lab work prior to leaving. Follow up in 3 months.   

## 2013-09-20 NOTE — Assessment & Plan Note (Signed)
Check cbc, clinically stable.

## 2013-09-20 NOTE — Assessment & Plan Note (Signed)
Check follow up bmet.

## 2013-09-20 NOTE — Assessment & Plan Note (Signed)
Clinically stable on synthroid, check TSH.  

## 2013-09-21 LAB — TSH: TSH: 20.38 u[IU]/mL — ABNORMAL HIGH (ref 0.350–4.500)

## 2013-09-23 ENCOUNTER — Other Ambulatory Visit: Payer: Self-pay | Admitting: Family

## 2013-09-23 DIAGNOSIS — E039 Hypothyroidism, unspecified: Secondary | ICD-10-CM

## 2013-09-23 MED ORDER — LEVOTHYROXINE SODIUM 200 MCG PO TABS: 200.0000 ug | ORAL_TABLET | Freq: Every day | ORAL | Status: DC

## 2013-09-23 NOTE — Telephone Encounter (Signed)
Also, please ask if she is taking synthroid.  Lab work looks like she has not been taking regularly.  If she is not taking regularly, she should restart.  If she is taking regularly, we need to increase dose and have her repeat lab testing in 6 weeks.

## 2013-09-23 NOTE — Telephone Encounter (Signed)
Yes, still needs to be increased.  She should increase to follow up TSH in 6 weeks.

## 2013-09-23 NOTE — Telephone Encounter (Signed)
Notified pt and she voices understanding. She states she was off of medication x 3-4 days before getting her last rx. Does dose still need to be increased and if so, what will the new dose be?

## 2013-09-23 NOTE — Telephone Encounter (Signed)
Patient informed, understood & agreed; lab order placed; new Rx to pharmacy/SLS

## 2013-09-23 NOTE — Telephone Encounter (Signed)
Please call pt and let her know that her potassium is still low. I would recommend that she take 2 tabs of her Kdur now and then 2 tabs in 4 hours. I would like for her to continue potassium 2 tabs once daily beginning tomorrow.

## 2013-10-02 ENCOUNTER — Telehealth: Payer: Self-pay | Admitting: *Deleted

## 2013-10-02 NOTE — Telephone Encounter (Signed)
Results faxed to Dr Sharlene Motts at [phone].

## 2013-10-02 NOTE — Telephone Encounter (Signed)
Message copied by Kathi Simpers on Wed Oct 02, 2013 10:37 AM ------      Message from: O'SULLIVAN, MELISSA      Created: Fri Sep 20, 2013  2:02 PM       Pt asks that we fax her labs from today to [facility] please when available. ------

## 2013-10-03 ENCOUNTER — Other Ambulatory Visit: Payer: Self-pay | Admitting: Family

## 2013-10-03 ENCOUNTER — Other Ambulatory Visit: Payer: Self-pay | Admitting: *Deleted

## 2013-10-03 DIAGNOSIS — E876 Hypokalemia: Secondary | ICD-10-CM

## 2013-10-03 NOTE — Telephone Encounter (Signed)
Please ask pt to return to the lab for follow up potassium.  I have written for 40 mEQ bid, however I am not sure until I see her lab work if she is going to need it once daily. Continue one tab BID until further instructions.

## 2013-10-03 NOTE — Telephone Encounter (Signed)
Received message from pt that she is needing a refill on her potassium supplement. States she had to double up on the dose for several days per instructions from [facility] and [place]. Pt states she pharmacy told her it is too soon to refill. Pt states she is back on one tablet twice a day and needs additional refills.  Pharmacy told pt insurance may not pay for additional Rx due to current instructions.  Please advise if ok to refill and if so do we leave current instructions as they are?

## 2013-10-03 NOTE — Telephone Encounter (Signed)
Called pharmacy who stated rx has already been filled and picked up.

## 2013-10-04 ENCOUNTER — Telehealth: Payer: Self-pay | Admitting: Family

## 2013-10-04 MED ORDER — POTASSIUM CHLORIDE CRYS ER 20 MEQ PO TBCR: 40.0000 meq | EXTENDED_RELEASE_TABLET | Freq: Two times a day (BID) | ORAL | Status: DC

## 2013-10-04 NOTE — Telephone Encounter (Signed)
Refill potassium cl er 20 meq tablet qty 30 take 1 tablet by mouth once daily

## 2013-10-04 NOTE — Telephone Encounter (Signed)
Notified pt and she voices understanding. Lab order entered as pt states she will try to come today or Monday at the latest.

## 2013-10-04 NOTE — Telephone Encounter (Signed)
See previous phone note of 10/02/13.

## 2013-10-07 ENCOUNTER — Telehealth: Payer: Self-pay | Admitting: *Deleted
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
## 2013-12-20 ENCOUNTER — Ambulatory Visit: Payer: No Typology Code available for payment source | Admitting: Family

## 2013-12-20 DIAGNOSIS — Z0289 Encounter for other administrative examinations: Secondary | ICD-10-CM

## 2013-12-25 ENCOUNTER — Other Ambulatory Visit: Payer: Self-pay | Admitting: Family

## 2013-12-25 NOTE — Telephone Encounter (Signed)
Rx request to pharmacy/SLS  

## 2013-12-30 ENCOUNTER — Other Ambulatory Visit: Payer: Self-pay | Admitting: Family

## 2013-12-30 NOTE — Telephone Encounter (Signed)
Refill sent per [facility] refill protocol/SLS  

## 2014-03-10 ENCOUNTER — Other Ambulatory Visit: Payer: Self-pay | Admitting: Family

## 2014-03-10 NOTE — Telephone Encounter (Signed)
Rx request to pharmacy/SLS  

## 2014-04-14 ENCOUNTER — Other Ambulatory Visit: Payer: Self-pay | Admitting: Family

## 2014-04-15 NOTE — Telephone Encounter (Signed)
Left message for patient to return my call.

## 2014-04-15 NOTE — Telephone Encounter (Signed)
2 week supply of furosemide sent to pharmacy. Pt last seen by [place] in 09/2013 and advised f/u in February. Pt no showed that appt.  Please call pt to arrange f/u soon.

## 2014-04-16 NOTE — Telephone Encounter (Signed)
Left message for patient to return my call.

## 2014-04-17 NOTE — Telephone Encounter (Signed)
apptointment scheduled for 05/05/14

## 2014-05-05 ENCOUNTER — Ambulatory Visit: Payer: No Typology Code available for payment source | Admitting: Family

## 2014-05-09 ENCOUNTER — Telehealth: Payer: Self-pay | Admitting: Family

## 2014-05-09 MED ORDER — FUROSEMIDE 40 MG PO TABS: ORAL_TABLET | ORAL | Status: DC

## 2014-05-09 NOTE — Telephone Encounter (Signed)
Refill sent. Notified pt. 

## 2014-05-09 NOTE — Telephone Encounter (Signed)
Patient left message requesting small refill on Furosemide, states she had an appt on Monday 6/22 and it had to be rescheduled to Monday 6/29 so she is out of the medication

## 2014-05-12 ENCOUNTER — Encounter: Payer: Self-pay | Admitting: Family

## 2014-05-12 ENCOUNTER — Ambulatory Visit (INDEPENDENT_AMBULATORY_CARE_PROVIDER_SITE_OTHER): Payer: No Typology Code available for payment source | Admitting: Family

## 2014-05-12 VITALS — BP 116/74 | HR 85 | Temp 98.0°F | Resp 18 | Ht 67.0 in | Wt 244.1 lb

## 2014-05-12 DIAGNOSIS — I700 Atherosclerosis of aorta: Secondary | ICD-10-CM

## 2014-05-12 DIAGNOSIS — E876 Hypokalemia: Secondary | ICD-10-CM

## 2014-05-12 DIAGNOSIS — B182 Chronic viral hepatitis C: Secondary | ICD-10-CM

## 2014-05-12 DIAGNOSIS — Z8619 Personal history of other infectious and parasitic diseases: Secondary | ICD-10-CM

## 2014-05-12 DIAGNOSIS — D649 Anemia, unspecified: Secondary | ICD-10-CM

## 2014-05-12 DIAGNOSIS — K7460 Unspecified cirrhosis of liver: Secondary | ICD-10-CM

## 2014-05-12 DIAGNOSIS — F17200 Nicotine dependence, unspecified, uncomplicated: Secondary | ICD-10-CM

## 2014-05-12 DIAGNOSIS — IMO0001 Reserved for inherently not codable concepts without codable children: Secondary | ICD-10-CM

## 2014-05-12 DIAGNOSIS — E039 Hypothyroidism, unspecified: Secondary | ICD-10-CM

## 2014-05-12 DIAGNOSIS — D509 Iron deficiency anemia, unspecified: Secondary | ICD-10-CM

## 2014-05-12 NOTE — Assessment & Plan Note (Signed)
We discussed smoking cessation 

## 2014-05-12 NOTE — Patient Instructions (Addendum)
Please complete lab work at [facility]. Work hard on quitting smoking. Follow up in 4 months.

## 2014-05-12 NOTE — Assessment & Plan Note (Signed)
Check b12, iron, folate, ferritin, cbc.

## 2014-05-12 NOTE — Assessment & Plan Note (Signed)
She continues lasix.  Not on K Dur due to muscle cramping. Check follow up K+.

## 2014-05-12 NOTE — Assessment & Plan Note (Addendum)
Obtain follow up TSH, continue synthroid.  

## 2014-05-12 NOTE — Assessment & Plan Note (Signed)
Completed treatment at [facility], undetectable HCV at 6 months post treatment.

## 2014-05-12 NOTE — Assessment & Plan Note (Signed)
The pt tells me that she is not longer considered a candidate for liver transplant now that her hep C virus has been cleared. She continues to follow with [facility] for monitoring of her hep c status.  Obtain LFT today.
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
## 2014-05-12 NOTE — Progress Notes (Signed)
Pre visit review using our clinic review tool, if applicable. No additional management support is needed unless otherwise documented below in the visit note. 

## 2014-05-25 ENCOUNTER — Other Ambulatory Visit: Payer: Self-pay | Admitting: Family

## 2014-06-13 ENCOUNTER — Telehealth: Payer: Self-pay | Admitting: *Deleted

## 2014-06-13 MED ORDER — FUROSEMIDE 40 MG PO TABS: ORAL_TABLET | ORAL | Status: DC

## 2014-06-13 NOTE — Telephone Encounter (Signed)
Received call from pt [facility] Refill sent.

## 2014-08-07 ENCOUNTER — Other Ambulatory Visit: Payer: Self-pay | Admitting: Family

## 2014-08-07 NOTE — Telephone Encounter (Signed)
Informed patient of medication refill and she scheduled appointment for 09/15/14

## 2014-08-07 NOTE — Telephone Encounter (Signed)
Levothyroxine refill sent to pharmacy.  Pt is due for follow up on or after 09/11/14.  Please call pt to arrange follow up.

## 2014-08-27 ENCOUNTER — Other Ambulatory Visit: Payer: Self-pay | Admitting: Family

## 2014-08-27 NOTE — Telephone Encounter (Signed)
Refill sent.

## 2014-09-15 ENCOUNTER — Ambulatory Visit: Payer: Self-pay | Admitting: Family

## 2014-09-15 DIAGNOSIS — Z0289 Encounter for other administrative examinations: Secondary | ICD-10-CM

## 2014-10-23 ENCOUNTER — Other Ambulatory Visit: Payer: Self-pay | Admitting: Family

## 2014-10-23 NOTE — Telephone Encounter (Signed)
Furosemide and levothyroxine refilled for one month. PT is due for an OV. JG//CMA

## 2014-12-08 ENCOUNTER — Ambulatory Visit: Payer: Self-pay | Admitting: Family

## 2014-12-22 ENCOUNTER — Ambulatory Visit: Payer: Self-pay | Admitting: Family

## 2014-12-25 ENCOUNTER — Other Ambulatory Visit: Payer: Self-pay | Admitting: Family

## 2014-12-25 NOTE — Telephone Encounter (Signed)
Patient requesting this to be refilled.

## 2014-12-26 NOTE — Telephone Encounter (Signed)
Done//AB/CMA 

## 2015-01-12 ENCOUNTER — Ambulatory Visit: Payer: Self-pay | Admitting: Family

## 2015-01-15 ENCOUNTER — Telehealth: Payer: Self-pay | Admitting: Family

## 2015-01-15 ENCOUNTER — Encounter: Payer: Self-pay | Admitting: Family

## 2015-01-15 NOTE — Telephone Encounter (Signed)
PT was no show for appt on 02/29- 4th no show in a row. She has rescheduled for 3/7. Charge no show?

## 2015-01-15 NOTE — Telephone Encounter (Signed)
Charge request forwarded to Tiffany Adkins °

## 2015-01-15 NOTE — Telephone Encounter (Signed)
Yes, please charge no show fee.

## 2015-01-19 ENCOUNTER — Ambulatory Visit: Payer: PRIVATE HEALTH INSURANCE | Admitting: Family

## 2015-01-21 ENCOUNTER — Other Ambulatory Visit: Payer: Self-pay | Admitting: Family

## 2015-01-21 NOTE — Telephone Encounter (Signed)
PT was no show for appt on 3/7. Now 5th no show in a row. Charge no show?

## 2015-01-21 NOTE — Telephone Encounter (Signed)
Yes

## 2015-01-21 NOTE — Telephone Encounter (Signed)
Opened in error

## 2015-01-22 ENCOUNTER — Telehealth: Payer: Self-pay | Admitting: Family

## 2015-01-22 NOTE — Telephone Encounter (Signed)
Charge request forwarded °

## 2015-01-22 NOTE — Telephone Encounter (Addendum)
Patient dismissed from [facility] by Sandford CrazeMelissa O'Sullivan NP , effective January 15, 2015. Dismissal letter sent out by certified / registered mail. [facility]  Certified dismissal letter returned as undeliverable, unclaimed, return to sender after three attempts by [employer]. Letter placed in another envelope and resent as 1st class mail which does not require a signature. DAJ

## 2015-01-22 NOTE — Telephone Encounter (Signed)
error 

## 2015-01-28 ENCOUNTER — Other Ambulatory Visit: Payer: Self-pay | Admitting: Family

## 2015-01-28 NOTE — Telephone Encounter (Signed)
Rx sent to the pharmacy by e-script.//AB/CMA 

## 2015-02-16 ENCOUNTER — Ambulatory Visit: Payer: PRIVATE HEALTH INSURANCE | Admitting: Family

## 2015-03-27 ENCOUNTER — Other Ambulatory Visit: Payer: Self-pay | Admitting: Family

## 2015-05-27 ENCOUNTER — Other Ambulatory Visit: Payer: Self-pay | Admitting: Family

## 2015-06-06 ENCOUNTER — Other Ambulatory Visit: Payer: Self-pay | Admitting: Emergency Medicine

## 2015-06-06 ENCOUNTER — Emergency Department (HOSPITAL_BASED_OUTPATIENT_CLINIC_OR_DEPARTMENT_OTHER): Payer: 59

## 2015-06-06 ENCOUNTER — Inpatient Hospital Stay (HOSPITAL_BASED_OUTPATIENT_CLINIC_OR_DEPARTMENT_OTHER)
Admission: EM | Admit: 2015-06-06 | Discharge: 2015-06-08 | DRG: 433 | Disposition: A | Discharge status: Home / Self Care | Payer: 59 | Attending: Internal Medicine | Admitting: Internal Medicine

## 2015-06-06 ENCOUNTER — Encounter (HOSPITAL_BASED_OUTPATIENT_CLINIC_OR_DEPARTMENT_OTHER): Payer: Self-pay | Admitting: Emergency Medicine

## 2015-06-06 DIAGNOSIS — M7989 Other specified soft tissue disorders: Secondary | ICD-10-CM | POA: Diagnosis present

## 2015-06-06 DIAGNOSIS — Z79899 Other long term (current) drug therapy: Secondary | ICD-10-CM | POA: Diagnosis not present

## 2015-06-06 DIAGNOSIS — B1920 Unspecified viral hepatitis C without hepatic coma: Secondary | ICD-10-CM | POA: Diagnosis present

## 2015-06-06 DIAGNOSIS — R0602 Shortness of breath: Secondary | ICD-10-CM | POA: Diagnosis present

## 2015-06-06 DIAGNOSIS — E876 Hypokalemia: Secondary | ICD-10-CM

## 2015-06-06 DIAGNOSIS — E039 Hypothyroidism, unspecified: Secondary | ICD-10-CM | POA: Diagnosis present

## 2015-06-06 DIAGNOSIS — R188 Other ascites: Secondary | ICD-10-CM | POA: Diagnosis present

## 2015-06-06 DIAGNOSIS — E8770 Fluid overload, unspecified: Secondary | ICD-10-CM | POA: Diagnosis present

## 2015-06-06 DIAGNOSIS — D696 Thrombocytopenia, unspecified: Secondary | ICD-10-CM

## 2015-06-06 DIAGNOSIS — R140 Abdominal distension (gaseous): Secondary | ICD-10-CM | POA: Diagnosis present

## 2015-06-06 DIAGNOSIS — E871 Hypo-osmolality and hyponatremia: Secondary | ICD-10-CM | POA: Diagnosis present

## 2015-06-06 DIAGNOSIS — L03116 Cellulitis of left lower limb: Secondary | ICD-10-CM | POA: Diagnosis present

## 2015-06-06 DIAGNOSIS — K7460 Unspecified cirrhosis of liver: Principal | ICD-10-CM | POA: Diagnosis present

## 2015-06-06 DIAGNOSIS — Z8619 Personal history of other infectious and parasitic diseases: Secondary | ICD-10-CM | POA: Diagnosis present

## 2015-06-06 DIAGNOSIS — F17210 Nicotine dependence, cigarettes, uncomplicated: Secondary | ICD-10-CM | POA: Diagnosis present

## 2015-06-06 DIAGNOSIS — D649 Anemia, unspecified: Secondary | ICD-10-CM | POA: Diagnosis present

## 2015-06-06 HISTORY — DX: Disorder of thyroid, unspecified: E07.9

## 2015-06-06 LAB — URINALYSIS, ROUTINE W REFLEX MICROSCOPIC
Bilirubin Urine: NEGATIVE
GLUCOSE, UA: NEGATIVE mg/dL
Hgb urine dipstick: NEGATIVE
Ketones, ur: NEGATIVE mg/dL
LEUKOCYTES UA: NEGATIVE
NITRITE: NEGATIVE
PH: 5.5 (ref 5.0–8.0)
Protein, ur: NEGATIVE mg/dL
Specific Gravity, Urine: 1.006 (ref 1.005–1.030)
Urobilinogen, UA: 1 mg/dL (ref 0.0–1.0)

## 2015-06-06 LAB — BODY FLUID CELL COUNT WITH DIFFERENTIAL
EOS FL: 0 %
Lymphs, Fluid: 58 %
Monocyte-Macrophage-Serous Fluid: 23 % — ABNORMAL LOW (ref 50–90)
Neutrophil Count, Fluid: 19 % (ref 0–25)
Other Cells, Fluid: 0 %
WBC FLUID: 122 uL (ref 0–1000)

## 2015-06-06 LAB — GLUCOSE, PERITONEAL FLUID: GLUCOSE, PERITONEAL FLUID: 100 mg/dL

## 2015-06-06 LAB — CBC WITH DIFFERENTIAL/PLATELET
BASOS ABS: 0.1 10*3/uL (ref 0.0–0.1)
Basophils Relative: 1 % (ref 0–1)
EOS PCT: 3 % (ref 0–5)
Eosinophils Absolute: 0.2 10*3/uL (ref 0.0–0.7)
HCT: 31.8 % — ABNORMAL LOW (ref 36.0–46.0)
Hemoglobin: 10.7 g/dL — ABNORMAL LOW (ref 12.0–15.0)
Lymphocytes Relative: 17 % (ref 12–46)
Lymphs Abs: 1.3 10*3/uL (ref 0.7–4.0)
MCH: 35 pg — ABNORMAL HIGH (ref 26.0–34.0)
MCHC: 33.6 g/dL (ref 30.0–36.0)
MCV: 103.9 fL — AB (ref 78.0–100.0)
MONO ABS: 0.6 10*3/uL (ref 0.1–1.0)
Monocytes Relative: 8 % (ref 3–12)
NEUTROS ABS: 5.7 10*3/uL (ref 1.7–7.7)
Neutrophils Relative %: 72 % (ref 43–77)
Platelets: 77 10*3/uL — ABNORMAL LOW (ref 150–400)
RBC: 3.06 MIL/uL — ABNORMAL LOW (ref 3.87–5.11)
RDW: 18.4 % — AB (ref 11.5–15.5)
WBC: 7.9 10*3/uL (ref 4.0–10.5)

## 2015-06-06 LAB — COMPREHENSIVE METABOLIC PANEL
ALT: 18 U/L (ref 14–54)
ANION GAP: 6 (ref 5–15)
AST: 53 U/L — ABNORMAL HIGH (ref 15–41)
Albumin: 2.2 g/dL — ABNORMAL LOW (ref 3.5–5.0)
Alkaline Phosphatase: 87 U/L (ref 38–126)
BUN: 16 mg/dL (ref 6–20)
CHLORIDE: 90 mmol/L — AB (ref 101–111)
CO2: 30 mmol/L (ref 22–32)
CREATININE: 1.36 mg/dL — AB (ref 0.44–1.00)
Calcium: 8 mg/dL — ABNORMAL LOW (ref 8.9–10.3)
GFR calc Af Amer: 52 mL/min — ABNORMAL LOW (ref >60)
GFR calc non Af Amer: 45 mL/min — ABNORMAL LOW (ref >60)
Glucose, Bld: 94 mg/dL (ref 65–99)
Potassium: 2.6 mmol/L — CL (ref 3.5–5.1)
SODIUM: 126 mmol/L — AB (ref 135–145)
Total Bilirubin: 3.9 mg/dL — ABNORMAL HIGH (ref 0.3–1.2)
Total Protein: 7.7 g/dL (ref 6.5–8.1)

## 2015-06-06 LAB — ALBUMIN, FLUID (OTHER)

## 2015-06-06 LAB — PROTIME-INR
INR: 1.38 (ref 0.00–1.49)
Prothrombin Time: 17.1 seconds — ABNORMAL HIGH (ref 11.6–15.2)

## 2015-06-06 LAB — PROTEIN, BODY FLUID: Total protein, fluid: <3 g/dL

## 2015-06-06 LAB — LACTATE DEHYDROGENASE, PLEURAL OR PERITONEAL FLUID: LD FL: 96 U/L — AB (ref 3–23)

## 2015-06-06 LAB — APTT: aPTT: 36 seconds (ref 24–37)

## 2015-06-06 MED ORDER — FUROSEMIDE 10 MG/ML IJ SOLN
40.0000 mg | Freq: Every day | INTRAMUSCULAR | Status: DC
  Administered 2015-06-07 – 2015-06-08 (×2): 40 mg via INTRAVENOUS
  Filled 2015-06-06 (×2): qty 4

## 2015-06-06 MED ORDER — FUROSEMIDE 10 MG/ML IJ SOLN
40.0000 mg | INTRAMUSCULAR | Status: AC
  Administered 2015-06-06: 40 mg via INTRAVENOUS
  Filled 2015-06-06: qty 4

## 2015-06-06 MED ORDER — VANCOMYCIN HCL IN DEXTROSE 1-5 GM/200ML-% IV SOLN
1000.0000 mg | Freq: Once | INTRAVENOUS | Status: AC
  Administered 2015-06-06: 1000 mg via INTRAVENOUS
  Filled 2015-06-06: qty 200

## 2015-06-06 MED ORDER — ONDANSETRON HCL 4 MG PO TABS: 4.0000 mg | ORAL_TABLET | Freq: Four times a day (QID) | ORAL | Status: DC | PRN

## 2015-06-06 MED ORDER — ONDANSETRON HCL 4 MG/2ML IJ SOLN
4.0000 mg | Freq: Four times a day (QID) | INTRAMUSCULAR | Status: DC | PRN
Start: 2015-06-06 — End: 2015-06-08

## 2015-06-06 MED ORDER — POTASSIUM CHLORIDE CRYS ER 20 MEQ PO TBCR
40.0000 meq | EXTENDED_RELEASE_TABLET | Freq: Once | ORAL | Status: AC
  Administered 2015-06-06: 40 meq via ORAL
  Filled 2015-06-06: qty 2

## 2015-06-06 MED ORDER — SPIRONOLACTONE 25 MG PO TABS
25.0000 mg | ORAL_TABLET | Freq: Two times a day (BID) | ORAL | Status: DC
  Administered 2015-06-07 – 2015-06-08 (×4): 25 mg via ORAL
  Filled 2015-06-06 (×5): qty 1

## 2015-06-06 MED ORDER — ONDANSETRON HCL 4 MG/2ML IJ SOLN: 4.0000 mg | Freq: Three times a day (TID) | INTRAMUSCULAR | Status: AC | PRN

## 2015-06-06 MED ORDER — LEVOTHYROXINE SODIUM 200 MCG PO TABS
200.0000 ug | ORAL_TABLET | Freq: Every day | ORAL | Status: DC
  Administered 2015-06-07 – 2015-06-08 (×2): 200 ug via ORAL
  Filled 2015-06-06 (×2): qty 1
  Filled 2015-06-06: qty 2
  Filled 2015-06-06: qty 1

## 2015-06-06 MED ORDER — SODIUM CHLORIDE 0.9 % IJ SOLN
3.0000 mL | Freq: Two times a day (BID) | INTRAMUSCULAR | Status: DC
  Administered 2015-06-07 – 2015-06-08 (×3): 3 mL via INTRAVENOUS

## 2015-06-06 MED ORDER — SODIUM CHLORIDE 0.9 % IV BOLUS (SEPSIS)
1000.0000 mL | Freq: Once | INTRAVENOUS | Status: AC
  Administered 2015-06-06: 1000 mL via INTRAVENOUS

## 2015-06-06 NOTE — ED Notes (Signed)
Phone Hand-Off Report given to [facility] with [employer]

## 2015-06-06 NOTE — ED Notes (Signed)
In room for rounding, abd catheter found on floor, pressure dsg appled to site, EDP informed

## 2015-06-06 NOTE — ED Notes (Signed)
Pt placed on cont cardiac/ pox monitoring 

## 2015-06-06 NOTE — ED Notes (Signed)
EDP at bedside to explain procedure to pt

## 2015-06-06 NOTE — ED Notes (Signed)
Pt tolerated procedure well, golden colored drainage noted in tubing and bag, fluid sent to lab per EDPorders

## 2015-06-06 NOTE — ED Notes (Signed)
12 lead EKG done, to EDP for evaluation 
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
## 2015-06-06 NOTE — Progress Notes (Signed)
Accepted transfer from [facility] -   Tiffany Adkins is a 49yo woman with PMH of Hepatitis C, liver cirrhosis, hypothyroidism.  Has been out of medications for 3 months including diuretics and presents with ascites, LE edema/anasarca; about 80 pounds +.  She had a paracentesis in the ED; currently ongoing.  Hyponatremia, hypokalemia.  CXR without pulmonary edema.  Cellulitis to left leg, vancomycin given.      Accepted to telemetry bed at [facility].   Debe Coder, MD

## 2015-06-06 NOTE — ED Notes (Signed)
Procedural permit obtained, Time-Out performed

## 2015-06-06 NOTE — ED Notes (Signed)
Presents with bilateral legs swollen

## 2015-06-06 NOTE — ED Notes (Addendum)
Pt has not taken daily medications for past 3 months and in past month pt has been feeling progressive swelling and joint stiffness, along with weakness and SOB with ambulation.  Legs and feet visibly very swollen and puffy.

## 2015-06-06 NOTE — Progress Notes (Signed)
ANTIBIOTIC CONSULT NOTE - INITIAL  Pharmacy Consult for Vancomycin  Indication: Cellulitis  No Known Allergies  Patient Measurements: Height:  (170.2 cm) Weight: 235 lb (106.595 kg) IBW/kg (Calculated) : 61.6  Vital Signs: Temp: 97.8 F (36.6 C) (07/23 2126) Temp Source: Oral (07/23 2126) BP: 103/66 mmHg (07/23 2126) Pulse Rate: 82 (07/23 2126)  Labs:  Recent Labs  06/06/15 1640  WBC 7.9  HGB 10.7*  PLT 77*  CREATININE 1.36*   Estimated Creatinine Clearance: 62.9 mL/min (by C-G formula based on Cr of 1.36).  Medical History: Past Medical History  Diagnosis Date  . Obesity   . Hepatitis C   . Thyroid disease     Assessment: 49 y/o F tx from [facility] with left leg cellulitis, WBC WNL, mild renal dysfunction noted, other labs reviewed.   Goal of Therapy:  Vancomycin trough level 10-15 mcg/ml  Plan:  -Vancomycin 1000 mg IV q12h -Vancomycin trough at steady state  Tiffany Adkins 06/06/2015,11:59 PM
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
## 2015-06-06 NOTE — ED Notes (Signed)
EDP notified of K= level 2.6

## 2015-06-06 NOTE — ED Notes (Signed)
Patient transported to X-ray 

## 2015-06-06 NOTE — ED Notes (Signed)
Pt moved to rm 14 for procedure by EDP, placed on cardiac monitor w/ cont POX monitoring, VS @ q47min

## 2015-06-07 DIAGNOSIS — R188 Other ascites: Secondary | ICD-10-CM

## 2015-06-07 LAB — BASIC METABOLIC PANEL
ANION GAP: 12 (ref 5–15)
Anion gap: 9 (ref 5–15)
Anion gap: 6 (ref 5–15)
BUN: 13 mg/dL (ref 6–20)
BUN: 14 mg/dL (ref 6–20)
BUN: 15 mg/dL (ref 6–20)
CO2: 27 mmol/L (ref 22–32)
CO2: 32 mmol/L (ref 22–32)
CO2: 33 mmol/L — ABNORMAL HIGH (ref 22–32)
CREATININE: 1.38 mg/dL — AB (ref 0.44–1.00)
Calcium: 8.1 mg/dL — ABNORMAL LOW (ref 8.9–10.3)
Calcium: 7.7 mg/dL — ABNORMAL LOW (ref 8.9–10.3)
Calcium: 7.7 mg/dL — ABNORMAL LOW (ref 8.9–10.3)
Chloride: 88 mmol/L — ABNORMAL LOW (ref 101–111)
Chloride: 89 mmol/L — ABNORMAL LOW (ref 101–111)
Chloride: 90 mmol/L — ABNORMAL LOW (ref 101–111)
Creatinine, Ser: 1.36 mg/dL — ABNORMAL HIGH (ref 0.44–1.00)
Creatinine, Ser: 1.32 mg/dL — ABNORMAL HIGH (ref 0.44–1.00)
GFR calc Af Amer: 51 mL/min — ABNORMAL LOW (ref >60)
GFR calc Af Amer: 54 mL/min — ABNORMAL LOW (ref >60)
GFR calc non Af Amer: 44 mL/min — ABNORMAL LOW (ref >60)
GFR calc non Af Amer: 45 mL/min — ABNORMAL LOW (ref >60)
GFR, EST AFRICAN AMERICAN: 52 mL/min — AB (ref >60)
GFR, EST NON AFRICAN AMERICAN: 46 mL/min — AB (ref >60)
Glucose, Bld: 103 mg/dL — ABNORMAL HIGH (ref 65–99)
Glucose, Bld: 101 mg/dL — ABNORMAL HIGH (ref 65–99)
Glucose, Bld: 90 mg/dL (ref 65–99)
POTASSIUM: 2.7 mmol/L — AB (ref 3.5–5.1)
Potassium: 2.7 mmol/L — CL (ref 3.5–5.1)
Potassium: 3 mmol/L — ABNORMAL LOW (ref 3.5–5.1)
SODIUM: 129 mmol/L — AB (ref 135–145)
Sodium: 129 mmol/L — ABNORMAL LOW (ref 135–145)
Sodium: 128 mmol/L — ABNORMAL LOW (ref 135–145)

## 2015-06-07 LAB — CBC
HCT: 30.8 % — ABNORMAL LOW (ref 36.0–46.0)
Hemoglobin: 10.4 g/dL — ABNORMAL LOW (ref 12.0–15.0)
MCH: 35.3 pg — AB (ref 26.0–34.0)
MCHC: 33.8 g/dL (ref 30.0–36.0)
MCV: 104.4 fL — ABNORMAL HIGH (ref 78.0–100.0)
PLATELETS: 62 10*3/uL — AB (ref 150–400)
RBC: 2.95 MIL/uL — ABNORMAL LOW (ref 3.87–5.11)
RDW: 18.9 % — AB (ref 11.5–15.5)
WBC: 5.9 10*3/uL (ref 4.0–10.5)

## 2015-06-07 LAB — TSH: TSH: >90 u[IU]/mL — ABNORMAL HIGH (ref 0.350–4.500)

## 2015-06-07 LAB — MAGNESIUM: MAGNESIUM: 1.4 mg/dL — AB (ref 1.7–2.4)

## 2015-06-07 LAB — C-REACTIVE PROTEIN: CRP: 4.4 mg/dL — ABNORMAL HIGH (ref <1.0)

## 2015-06-07 LAB — BRAIN NATRIURETIC PEPTIDE: B Natriuretic Peptide: 9.6 pg/mL (ref 0.0–100.0)

## 2015-06-07 LAB — LACTATE DEHYDROGENASE: LDH: 263 U/L — AB (ref 98–192)

## 2015-06-07 MED ORDER — VANCOMYCIN HCL IN DEXTROSE 1-5 GM/200ML-% IV SOLN
1000.0000 mg | Freq: Two times a day (BID) | INTRAVENOUS | Status: DC
  Administered 2015-06-07 – 2015-06-08 (×3): 1000 mg via INTRAVENOUS
  Filled 2015-06-07 (×5): qty 200

## 2015-06-07 MED ORDER — MAGNESIUM SULFATE 2 GM/50ML IV SOLN
2.0000 g | Freq: Once | INTRAVENOUS | Status: AC
  Administered 2015-06-07: 2 g via INTRAVENOUS
  Filled 2015-06-07: qty 50

## 2015-06-07 MED ORDER — POTASSIUM CHLORIDE 10 MEQ/100ML IV SOLN
10.0000 meq | INTRAVENOUS | Status: AC
  Administered 2015-06-07 (×2): 10 meq via INTRAVENOUS
  Filled 2015-06-07 (×2): qty 100

## 2015-06-07 MED ORDER — POTASSIUM CHLORIDE CRYS ER 20 MEQ PO TBCR
40.0000 meq | EXTENDED_RELEASE_TABLET | Freq: Three times a day (TID) | ORAL | Status: AC
  Administered 2015-06-07 – 2015-06-08 (×4): 40 meq via ORAL
  Filled 2015-06-07 (×5): qty 2

## 2015-06-07 MED ORDER — OXYCODONE HCL 5 MG PO TABS
5.0000 mg | ORAL_TABLET | Freq: Four times a day (QID) | ORAL | Status: DC | PRN
  Administered 2015-06-07 – 2015-06-08 (×5): 5 mg via ORAL
  Filled 2015-06-07 (×5): qty 1

## 2015-06-07 MED ORDER — ALBUMIN HUMAN 25 % IV SOLN
12.5000 g | Freq: Once | INTRAVENOUS | Status: AC
  Administered 2015-06-08: 12.5 g via INTRAVENOUS
  Filled 2015-06-07 (×2): qty 50

## 2015-06-07 MED ORDER — POTASSIUM CHLORIDE CRYS ER 20 MEQ PO TBCR
40.0000 meq | EXTENDED_RELEASE_TABLET | Freq: Once | ORAL | Status: AC
  Administered 2015-06-07: 40 meq via ORAL
  Filled 2015-06-07: qty 2

## 2015-06-07 NOTE — Progress Notes (Signed)
CRITICAL VALUE ALERT  Critical value received: Potassium 2.7  Date of notification: 06/07/15  Time of notification:0132  Critical value read back: Yes  Nurse who received alert: Nettie Elm, RN  MD notified (1st page): on call MD text paged  Time of first page: 0137  MD notified (2nd page):  Intervention: Potassium IV replacement was ordered
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
## 2015-06-07 NOTE — Progress Notes (Signed)
CRITICAL VALUE ALERT  Critical value received: Potassium 2.7  Date of notification: 06/07/15  Time of notification:0739  Critical value read back: Yes, lab technician Cyprus McCauley  Nurse who received alert: Nettie Elm, RN  MD notified Time of first page: [phone]

## 2015-06-07 NOTE — Progress Notes (Signed)
ANTIBIOTIC CONSULT NOTE - FOLLOW UP  Pharmacy Consult for Vancomycin Indication:  Left ext. Cellulitis   No Known Allergies  Patient Measurements: Height:  (170.2 cm) Weight: 287 lb 11.2 oz (130.5 kg) (patient stated previous weight not actual weighed) IBW/kg (Calculated) : 61.6  Vital Signs: Temp: 98.1 F (36.7 C) (07/24 0446) Temp Source: Oral (07/24 0446) BP: 100/68 mmHg (07/24 0446) Pulse Rate: 84 (07/24 0446) Intake/Output from previous day: 07/23 0701 - 07/24 0700 In: 4240 [P.O.:240; I.V.:600] Out: 2150 [Urine:2150] Intake/Output from this shift:    Labs:  Recent Labs  06/06/15 1640 06/07/15 0030 06/07/15 0628  WBC 7.9  --   --   HGB 10.7*  --   --   PLT 77*  --   --   CREATININE 1.36* 1.38* 1.36*   Estimated Creatinine Clearance: 70.5 mL/min (by C-G formula based on Cr of 1.36). No results for input(s): VANCOTROUGH, VANCOPEAK, VANCORANDOM, GENTTROUGH, GENTPEAK, GENTRANDOM, TOBRATROUGH, TOBRAPEAK, TOBRARND, AMIKACINPEAK, AMIKACINTROU, AMIKACIN in the last 72 hours.   Microbiology: No results found for this or any previous visit (from the past 720 hour(s)).  Anti-infectives    Start     Dose/Rate Route Frequency Ordered Stop   06/07/15 0400  vancomycin (VANCOCIN) IVPB 1000 mg/200 mL premix     1,000 mg 200 mL/hr over 60 Minutes Intravenous Every 12 hours 06/07/15 0004     06/06/15 1615  vancomycin (VANCOCIN) IVPB 1000 mg/200 mL premix     1,000 mg 200 mL/hr over 60 Minutes Intravenous  Once 06/06/15 1609 06/06/15 1807      Assessment: Pt with left extremity cellulitis. WBC wnl and afebrile. 7/23 Fluid cultures in process. Mild renal impairment (SCr 1.36, baseline 0.6-0.8)  Goal of Therapy:  Vanc trough level 10-15  Plan:  -Continue Vancomycin  -Van through as needed -monitor renal function  -F/U c/s / LOT    Neera Teng C. Marvis Moeller, PharmD Pharmacy Resident  Pager: [phone] 06/07/2015 8:43 AM

## 2015-06-08 ENCOUNTER — Inpatient Hospital Stay (HOSPITAL_COMMUNITY): Payer: 59

## 2015-06-08 LAB — COMPREHENSIVE METABOLIC PANEL
ALBUMIN: 1.8 g/dL — AB (ref 3.5–5.0)
ALK PHOS: 68 U/L (ref 38–126)
ALT: 16 U/L (ref 14–54)
AST: 45 U/L — ABNORMAL HIGH (ref 15–41)
Anion gap: 5 (ref 5–15)
BILIRUBIN TOTAL: 3.3 mg/dL — AB (ref 0.3–1.2)
BUN: 13 mg/dL (ref 6–20)
CO2: 33 mmol/L — ABNORMAL HIGH (ref 22–32)
Calcium: 8 mg/dL — ABNORMAL LOW (ref 8.9–10.3)
Chloride: 93 mmol/L — ABNORMAL LOW (ref 101–111)
Creatinine, Ser: 1.32 mg/dL — ABNORMAL HIGH (ref 0.44–1.00)
GFR calc Af Amer: 54 mL/min — ABNORMAL LOW (ref >60)
GFR calc non Af Amer: 46 mL/min — ABNORMAL LOW (ref >60)
Glucose, Bld: 101 mg/dL — ABNORMAL HIGH (ref 65–99)
POTASSIUM: 3.3 mmol/L — AB (ref 3.5–5.1)
SODIUM: 131 mmol/L — AB (ref 135–145)
Total Protein: 6.7 g/dL (ref 6.5–8.1)

## 2015-06-08 LAB — T4, FREE: Free T4: <0.25 ng/dL — ABNORMAL LOW (ref 0.61–1.12)

## 2015-06-08 LAB — PATHOLOGIST SMEAR REVIEW

## 2015-06-08 MED ORDER — LEVOTHYROXINE SODIUM 200 MCG PO TABS: ORAL_TABLET | ORAL | Status: AC

## 2015-06-08 MED ORDER — LIDOCAINE HCL (PF) 1 % IJ SOLN
INTRAMUSCULAR | Status: AC
  Filled 2015-06-08: qty 10

## 2015-06-08 MED ORDER — CEPHALEXIN 500 MG PO CAPS: 500.0000 mg | ORAL_CAPSULE | Freq: Two times a day (BID) | ORAL | Status: AC

## 2015-06-08 MED ORDER — ALBUMIN HUMAN 25 % IV SOLN: 12.5000 g | Freq: Once | INTRAVENOUS | Status: DC

## 2015-06-08 MED ORDER — ALBUMIN HUMAN 25 % IV SOLN
12.5000 g | INTRAVENOUS | Status: AC
  Administered 2015-06-08: 12.5 g via INTRAVENOUS
  Filled 2015-06-08: qty 50

## 2015-06-08 MED ORDER — POTASSIUM CHLORIDE CRYS ER 20 MEQ PO TBCR: 40.0000 meq | EXTENDED_RELEASE_TABLET | Freq: Two times a day (BID) | ORAL | Status: AC

## 2015-06-08 MED ORDER — FUROSEMIDE 40 MG PO TABS: 40.0000 mg | ORAL_TABLET | Freq: Two times a day (BID) | ORAL | Status: AC

## 2015-06-08 NOTE — Evaluation (Signed)
Physical Therapy Evaluation Patient Details Name: Tiffany Adkins MRN: [id number] DOB: [birth year] Today's Date: 06/08/2015   History of Present Illness  49 y.o. female admitted to [facility] on 06/06/15 for SOB, volume overload due to ascites likely secondary to her hepatitis C.  She also has cellulitis of the L leg.  Pt with significant PMHx of thyroid disease, and left ankle fx surgery.    Clinical Impression  Pt is close to her baseline level of functioning and has been in the bed for several days.  She reports, and I agree, that once she gets home and back into her normal routine, her physical function should return to baseline.  She is walking mod I level and HR and O2 sats are stable with gait on RA.  PT to sign off.  Pt is safe to return home with her husband's assist.      Follow Up Recommendations No PT follow up    Equipment Recommendations  None recommended by PT    Recommendations for Other Services   NA     Precautions / Restrictions Precautions Precautions: None      Mobility  Bed Mobility Overal bed mobility: Modified Independent             General bed mobility comments: HOB raised, pt using bed rail for leverage  Transfers Overall transfer level: Modified independent Equipment used: None             General transfer comment: Pt using hands to control movement during transitions.   Ambulation/Gait Ambulation/Gait assistance: Modified independent (Device/Increase time) Ambulation Distance (Feet): 300 Feet Assistive device: None Gait Pattern/deviations: Step-through pattern;Wide base of support Gait velocity: decreased Gait velocity interpretation: Below normal speed for age/gender General Gait Details: stiff legged gait pattern, slow with wide BOS likely to compensate for her weight or pannus.  Gait stiffness improved with increased distance.  No DOE observed during gait.  O2 sats and HR remained stable.          Balance Overall balance assessment:  No apparent balance deficits (not formally assessed)                                           Pertinent Vitals/Pain Pain Assessment: No/denies pain    Home Living Family/patient expects to be discharged to:: Private residence Living Arrangements: Spouse/significant other Available Help at Discharge: Family Type of Home: House Home Access: Level entry     Home Layout: One level        Prior Function Level of Independence: Independent         Comments: Pt works full time, Health and safety inspector job        Extremity/Trunk Assessment   Upper Extremity Assessment: Overall WFL for tasks assessed           Lower Extremity Assessment: Generalized weakness      Cervical / Trunk Assessment: Normal  Communication   Communication: No difficulties  Cognition Arousal/Alertness: Awake/alert Behavior During Therapy: WFL for tasks assessed/performed Overall Cognitive Status: Within Functional Limits for tasks assessed                               Assessment/Plan    PT Assessment Patient needs continued PT services;Patent does not need any further PT services  PT Diagnosis Difficulty walking;Abnormality of gait;Generalized weakness   PT  Problem List Decreased strength;Decreased activity tolerance;Decreased mobility     PT Goals (Current goals can be found in the Care Plan section) Acute Rehab PT Goals Patient Stated Goal: to go home today and be back at work on Wednesday PT Goal Formulation: All assessment and education complete, DC therapy               End of Session   Activity Tolerance: Patient limited by fatigue Patient left: in bed;with call bell/phone within reach Nurse Communication: Mobility status         Time: 0981-1914 PT Time Calculation (min) (ACUTE ONLY): 17 min   Charges:   PT Evaluation $Initial PT Evaluation Tier I: 1 Procedure          Kataleia Quaranta B. Jamone Garrido, PT, DPT [phone]   06/08/2015, 9:59 PM
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
## 2015-06-08 NOTE — Progress Notes (Signed)
Dr. Susie Cassette paged to clarify if albumin should be given before or after procedure.

## 2015-06-08 NOTE — Progress Notes (Signed)
PT Cancellation Note  Patient Details Name: AHMIA COLFORD MRN: [id number] DOB: 14-Oct-1966   Cancelled Treatment:    Reason Eval/Treat Not Completed: Patient at procedure or test/unavailable. Pt off unit for ultrasound. Will check back as schedule allows to complete PT eval.    Conni Slipper 06/08/2015, 2:54 PM   Conni Slipper, PT, DPT Acute Rehabilitation Services Pager: [phone]

## 2015-06-08 NOTE — Procedures (Signed)
Successful US guided paracentesis from LLQ.  Yielded 5L of clear yellow fluid.  No immediate complications.  Pt tolerated well.   Specimen was not sent for labs.  Brayton El PA-C 06/08/2015 3:42 PM

## 2015-06-08 NOTE — Care Management Note (Addendum)
Case Management Note  Patient Details  Name: TIKI TUCCIARONE MRN: [id number] Date of Birth: 30-Aug-1966  Subjective/Objective:                 Patient from home with spouse. Will discharge today. Patient does not have PCP at this time. Patient has insurance with [employer]. Patient residing in [facility] and given [facility] number to call to see what MDs are accepting patients at this time with her insurance. Will follow up with patient to find out what PCP she chose so follow up with so appointment can be made.  Patient states that she will try to follow up with [facility], and that she will call to see if they will accept her. States she may owe them money. She also has a second choice for a PCP if that does not work. Patient stated that she feels more comfortable calling to schedule her own appointment for follow up.    Action/Plan:  Discharge to home self care.  Expected Discharge Date:                  Expected Discharge Plan:  Home/Self Care  In-House Referral:     Discharge planning Services  CM Consult  Post Acute Care Choice:  NA Choice offered to:     DME Arranged:    DME Agency:     HH Arranged:    HH Agency:     Status of Service:  Completed, signed off  Medicare Important Message Given:    Date Medicare IM Given:    Medicare IM give by:    Date Additional Medicare IM Given:    Additional Medicare Important Message give by:     If discussed at Long Length of Stay Meetings, dates discussed:    Additional Comments:  Lawerance Sabal, RN 06/08/2015, 12:47 PM

## 2015-06-08 NOTE — Progress Notes (Signed)
Nsg Discharge Note  Admit Date:  06/06/2015 Discharge date: 06/08/2015   Erskine Squibb to be D/C'd Home per MD order.  AVS completed.  Copy for chart, and copy for patient signed, and dated. Patient/caregiver able to verbalize understanding.  Discharge Medication:   Medication List    TAKE these medications        cephALEXin 500 MG capsule  Commonly known as:  KEFLEX  Take 1 capsule (500 mg total) by mouth 2 (two) times daily.     furosemide 40 MG tablet  Commonly known as:  LASIX  Take 1 tablet (40 mg total) by mouth 2 (two) times daily.     levothyroxine 200 MCG tablet  Commonly known as:  SYNTHROID, LEVOTHROID  take 1 tablet by mouth every morning ON AN EMPTY STOMACH     potassium chloride SA 20 MEQ tablet  Commonly known as:  K-DUR,KLOR-CON  Take 2 tablets (40 mEq total) by mouth 2 (two) times daily.     spironolactone 50 MG tablet  Commonly known as:  ALDACTONE  TAKE 1/2 TABLET BY MOUTH TWICE A DAY        Discharge Assessment: Filed Vitals:   06/08/15 1457  BP: 105/56  Pulse:   Temp:   Resp:    Skin clean, dry and intact without evidence of skin break down, no evidence of skin tears noted. Does have incision on RLQ from paracentesis with some leaking.  IV catheter discontinued intact. Site without signs and symptoms of complications - no redness or edema noted at insertion site, patient denies c/o pain - only slight tenderness at site.  Dressing with slight pressure applied.  D/c Instructions-Education: Discharge instructions given to patient/family with verbalized understanding. D/c education completed with patient/family including follow up instructions, medication list, d/c activities limitations if indicated, with other d/c instructions as indicated by MD - patient able to verbalize understanding, all questions fully answered. Patient instructed to return to ED, call 911, or call MD for any changes in condition.  Patient escorted via WC, and D/C home via  private auto.  Fumiko Cham Consuella Lose, RN 06/08/2015 6:45 PM

## 2015-06-10 LAB — BODY FLUID CULTURE
GRAM STAIN: NONE SEEN
Organism ID, Bacteria: NO GROWTH

## 2015-06-11 LAB — BODY FLUID CULTURE

## 2015-07-27 IMAGING — DX DG CHEST 2V
2 series · 2 of 2 positions shown · non-contrast
Comparison: None.

CLINICAL DATA: Shortness of breath and bilateral leg swelling.
Smoker.

EXAM:
CHEST  2 VIEW

[chest lat]
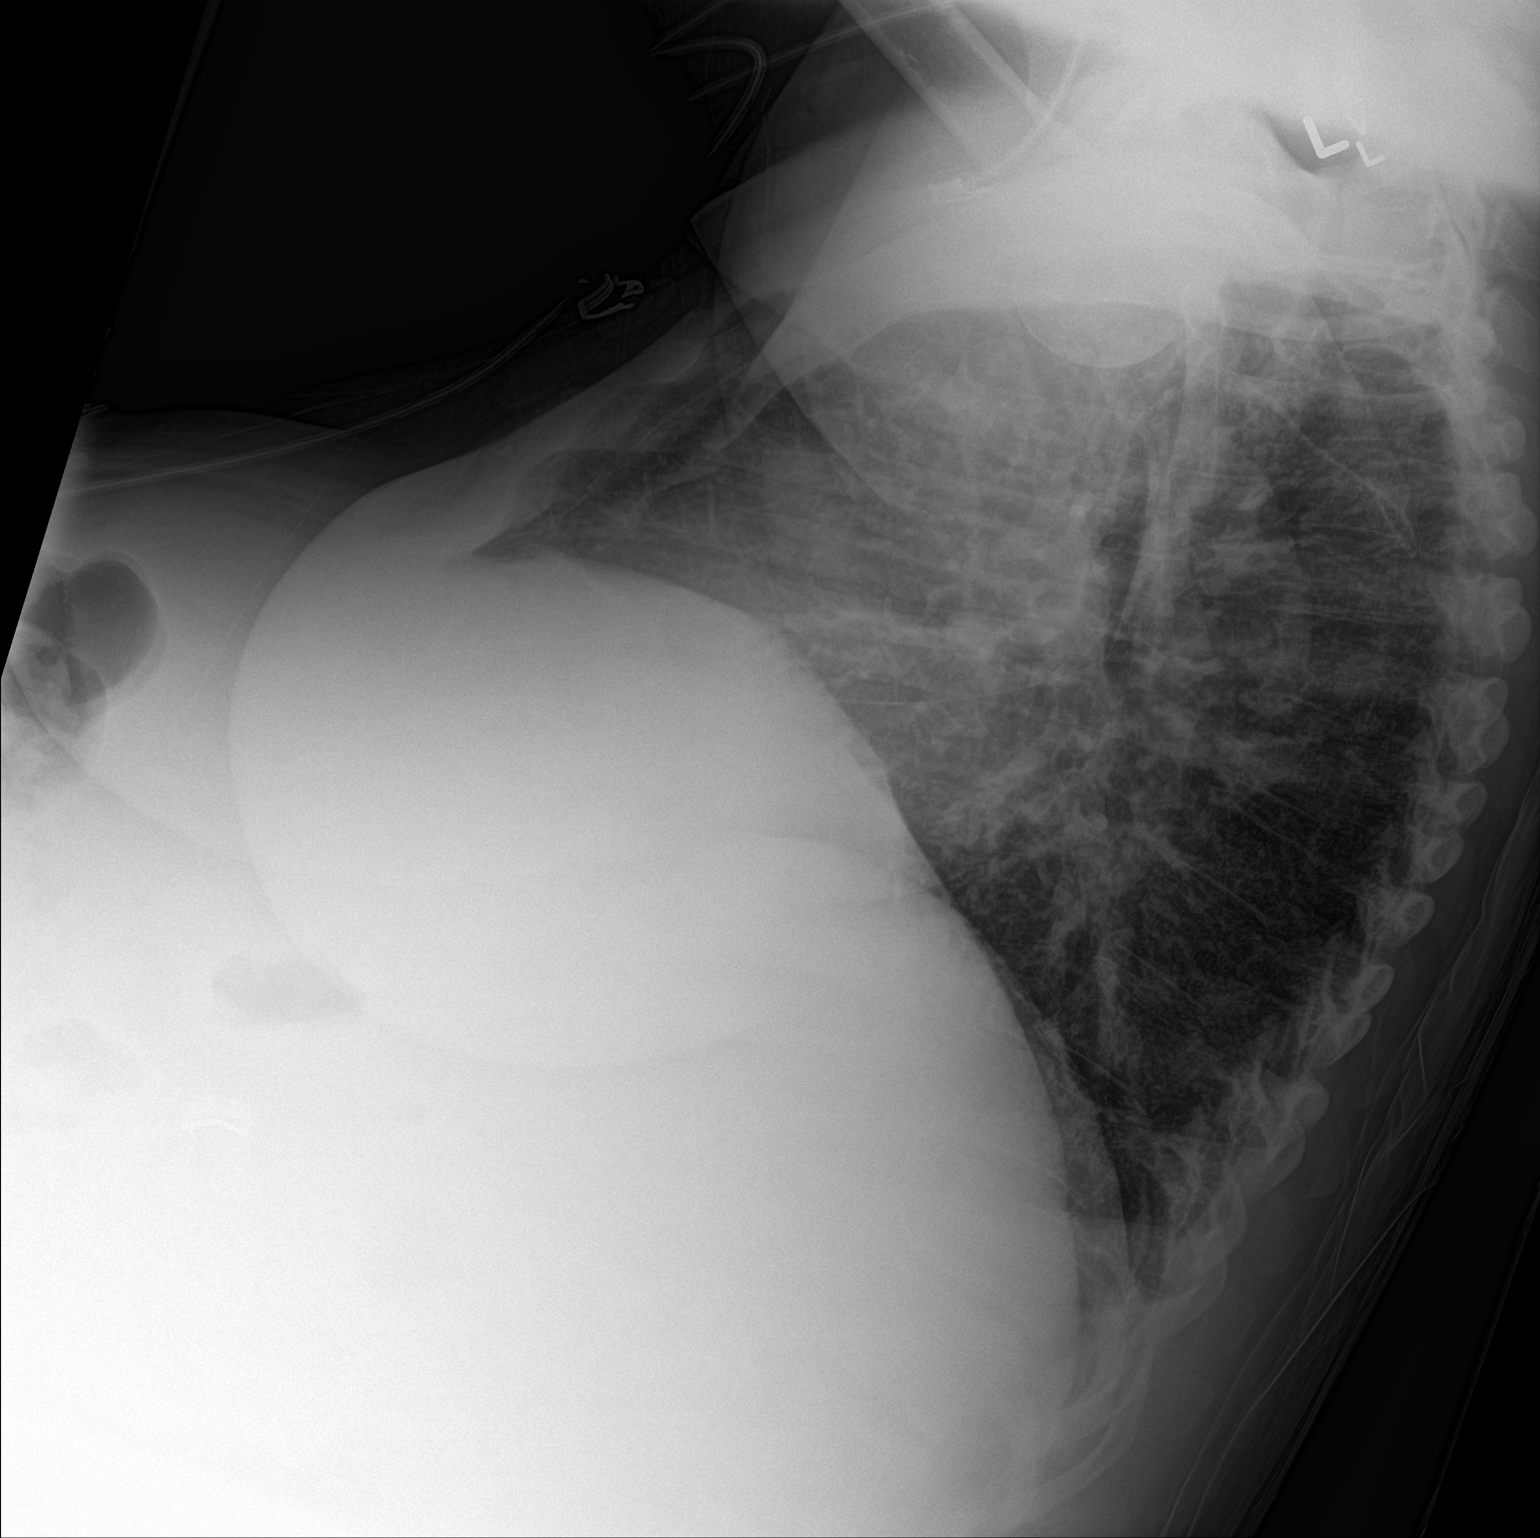

[chest ap]
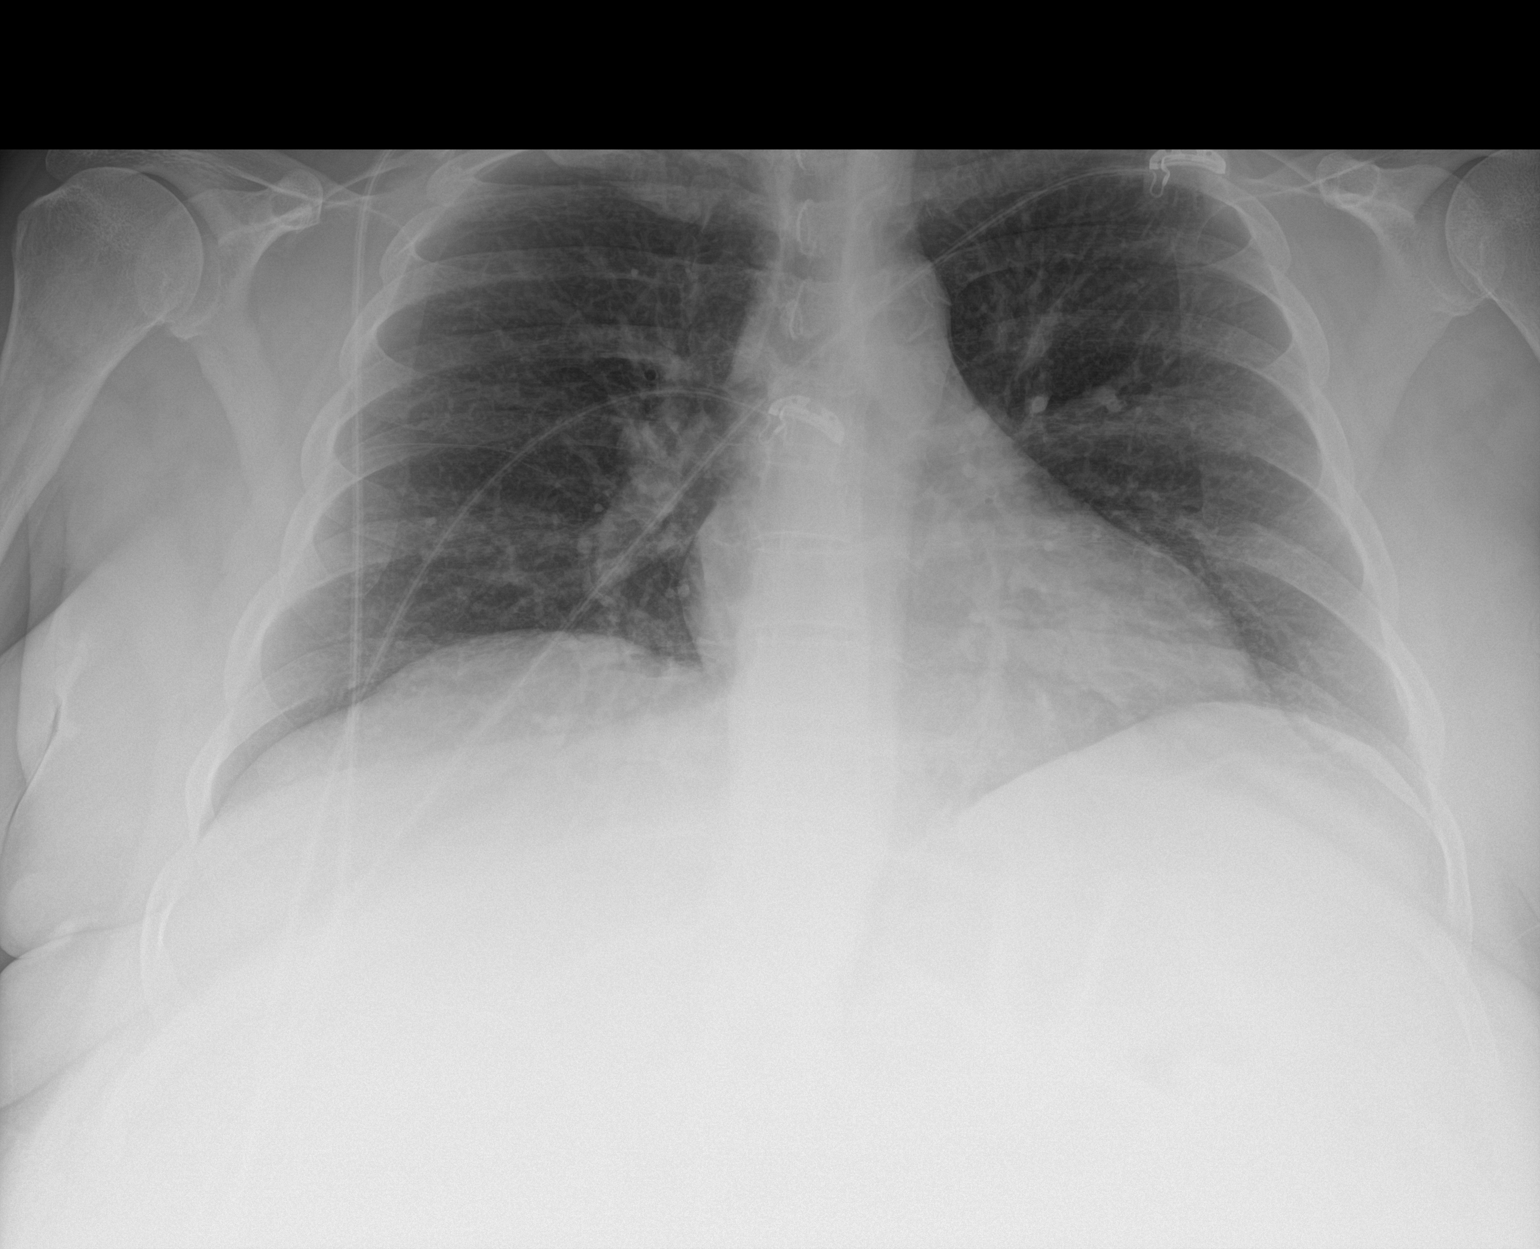

[2 of 2 positions shown; findings below may reference images not displayed]

FINDINGS: The cardiomediastinal silhouette is within normal limits. The lungs
are clear. No pleural effusion or pneumothorax is identified. No
acute osseous abnormality is identified.
IMPRESSION: No active cardiopulmonary disease.

## 2015-10-15 DEATH — deceased
